# Patient Record
Sex: Female | Born: 1980 | Race: White | Hispanic: No | Marital: Married | Smoking: Current some day smoker
Health system: Southern US, Community
[De-identification: ages and names within clinical notes are randomized; demographics above are authoritative.]

## PROBLEM LIST (undated history)

## (undated) DIAGNOSIS — F4310 Post-traumatic stress disorder, unspecified: Secondary | ICD-10-CM

## (undated) DIAGNOSIS — R569 Unspecified convulsions: Secondary | ICD-10-CM

## (undated) DIAGNOSIS — J45909 Unspecified asthma, uncomplicated: Secondary | ICD-10-CM

## (undated) DIAGNOSIS — D6859 Other primary thrombophilia: Secondary | ICD-10-CM

## (undated) DIAGNOSIS — O0990 Supervision of high risk pregnancy, unspecified, unspecified trimester: Secondary | ICD-10-CM

## (undated) DIAGNOSIS — D509 Iron deficiency anemia, unspecified: Secondary | ICD-10-CM

## (undated) HISTORY — DX: Iron deficiency anemia, unspecified: D50.9

## (undated) HISTORY — DX: Supervision of high risk pregnancy, unspecified, unspecified trimester: O09.90

## (undated) HISTORY — DX: Post-traumatic stress disorder, unspecified: F43.10

## (undated) HISTORY — PX: MOUTH SURGERY: SHX715

## (undated) HISTORY — PX: DILATION AND CURETTAGE OF UTERUS: SHX78

---
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
## 2002-01-14 ENCOUNTER — Inpatient Hospital Stay (HOSPITAL_COMMUNITY): Admission: AD | Admit: 2002-01-14 | Discharge: 2002-01-14 | Payer: Self-pay | Admitting: *Deleted

## 2002-01-15 ENCOUNTER — Encounter: Payer: Self-pay | Admitting: *Deleted

## 2002-03-21 ENCOUNTER — Inpatient Hospital Stay (HOSPITAL_COMMUNITY): Admission: AD | Admit: 2002-03-21 | Discharge: 2002-03-21 | Payer: Self-pay | Admitting: Family Medicine

## 2002-03-24 ENCOUNTER — Emergency Department (HOSPITAL_COMMUNITY): Admission: EM | Admit: 2002-03-24 | Discharge: 2002-03-24 | Payer: Self-pay | Admitting: Emergency Medicine

## 2002-03-24 ENCOUNTER — Encounter: Payer: Self-pay | Admitting: Emergency Medicine

## 2002-03-31 ENCOUNTER — Ambulatory Visit (HOSPITAL_COMMUNITY): Admission: RE | Admit: 2002-03-31 | Discharge: 2002-03-31 | Payer: Self-pay | Admitting: *Deleted

## 2002-06-01 ENCOUNTER — Encounter: Payer: Self-pay | Admitting: Obstetrics and Gynecology

## 2002-06-01 ENCOUNTER — Inpatient Hospital Stay (HOSPITAL_COMMUNITY): Admission: AD | Admit: 2002-06-01 | Discharge: 2002-06-03 | Payer: Self-pay | Admitting: Obstetrics and Gynecology

## 2002-06-02 ENCOUNTER — Encounter (INDEPENDENT_AMBULATORY_CARE_PROVIDER_SITE_OTHER): Payer: Self-pay | Admitting: Specialist

## 2002-11-25 ENCOUNTER — Emergency Department (HOSPITAL_COMMUNITY): Admission: EM | Admit: 2002-11-25 | Discharge: 2002-11-25 | Payer: Self-pay | Admitting: *Deleted

## 2004-07-18 ENCOUNTER — Emergency Department (HOSPITAL_COMMUNITY): Admission: EM | Admit: 2004-07-18 | Discharge: 2004-07-18 | Payer: Self-pay | Admitting: Emergency Medicine

## 2005-02-23 ENCOUNTER — Emergency Department (HOSPITAL_COMMUNITY): Admission: EM | Admit: 2005-02-23 | Discharge: 2005-02-24 | Payer: Self-pay | Admitting: Emergency Medicine

## 2005-04-09 ENCOUNTER — Emergency Department (HOSPITAL_COMMUNITY): Admission: EM | Admit: 2005-04-09 | Discharge: 2005-04-09 | Payer: Self-pay | Admitting: Emergency Medicine

## 2005-05-02 ENCOUNTER — Emergency Department (HOSPITAL_COMMUNITY): Admission: EM | Admit: 2005-05-02 | Discharge: 2005-05-02 | Payer: Self-pay | Admitting: Emergency Medicine

## 2005-12-13 ENCOUNTER — Emergency Department (HOSPITAL_COMMUNITY): Admission: EM | Admit: 2005-12-13 | Discharge: 2005-12-13 | Payer: Self-pay | Admitting: Emergency Medicine

## 2006-02-22 ENCOUNTER — Ambulatory Visit: Payer: Self-pay | Admitting: Oncology

## 2006-02-27 ENCOUNTER — Ambulatory Visit (HOSPITAL_COMMUNITY): Admission: RE | Admit: 2006-02-27 | Discharge: 2006-02-27 | Payer: Self-pay | Admitting: *Deleted

## 2006-03-13 ENCOUNTER — Ambulatory Visit (HOSPITAL_COMMUNITY): Admission: RE | Admit: 2006-03-13 | Discharge: 2006-03-13 | Payer: Self-pay | Admitting: *Deleted

## 2006-03-23 ENCOUNTER — Inpatient Hospital Stay (HOSPITAL_COMMUNITY): Admission: EM | Admit: 2006-03-23 | Discharge: 2006-03-25 | Payer: Self-pay | Admitting: Emergency Medicine

## 2006-04-22 IMAGING — CR DG CHEST 2V
2 series · 2 of 2 positions shown · non-contrast
Comparison: none

CLINICAL DATA: Fever and chest pain.  History of asthma.
 CHEST - 2 VIEW: 
 PA and lateral chest.  No comparison studies are available.

[w chest pa]
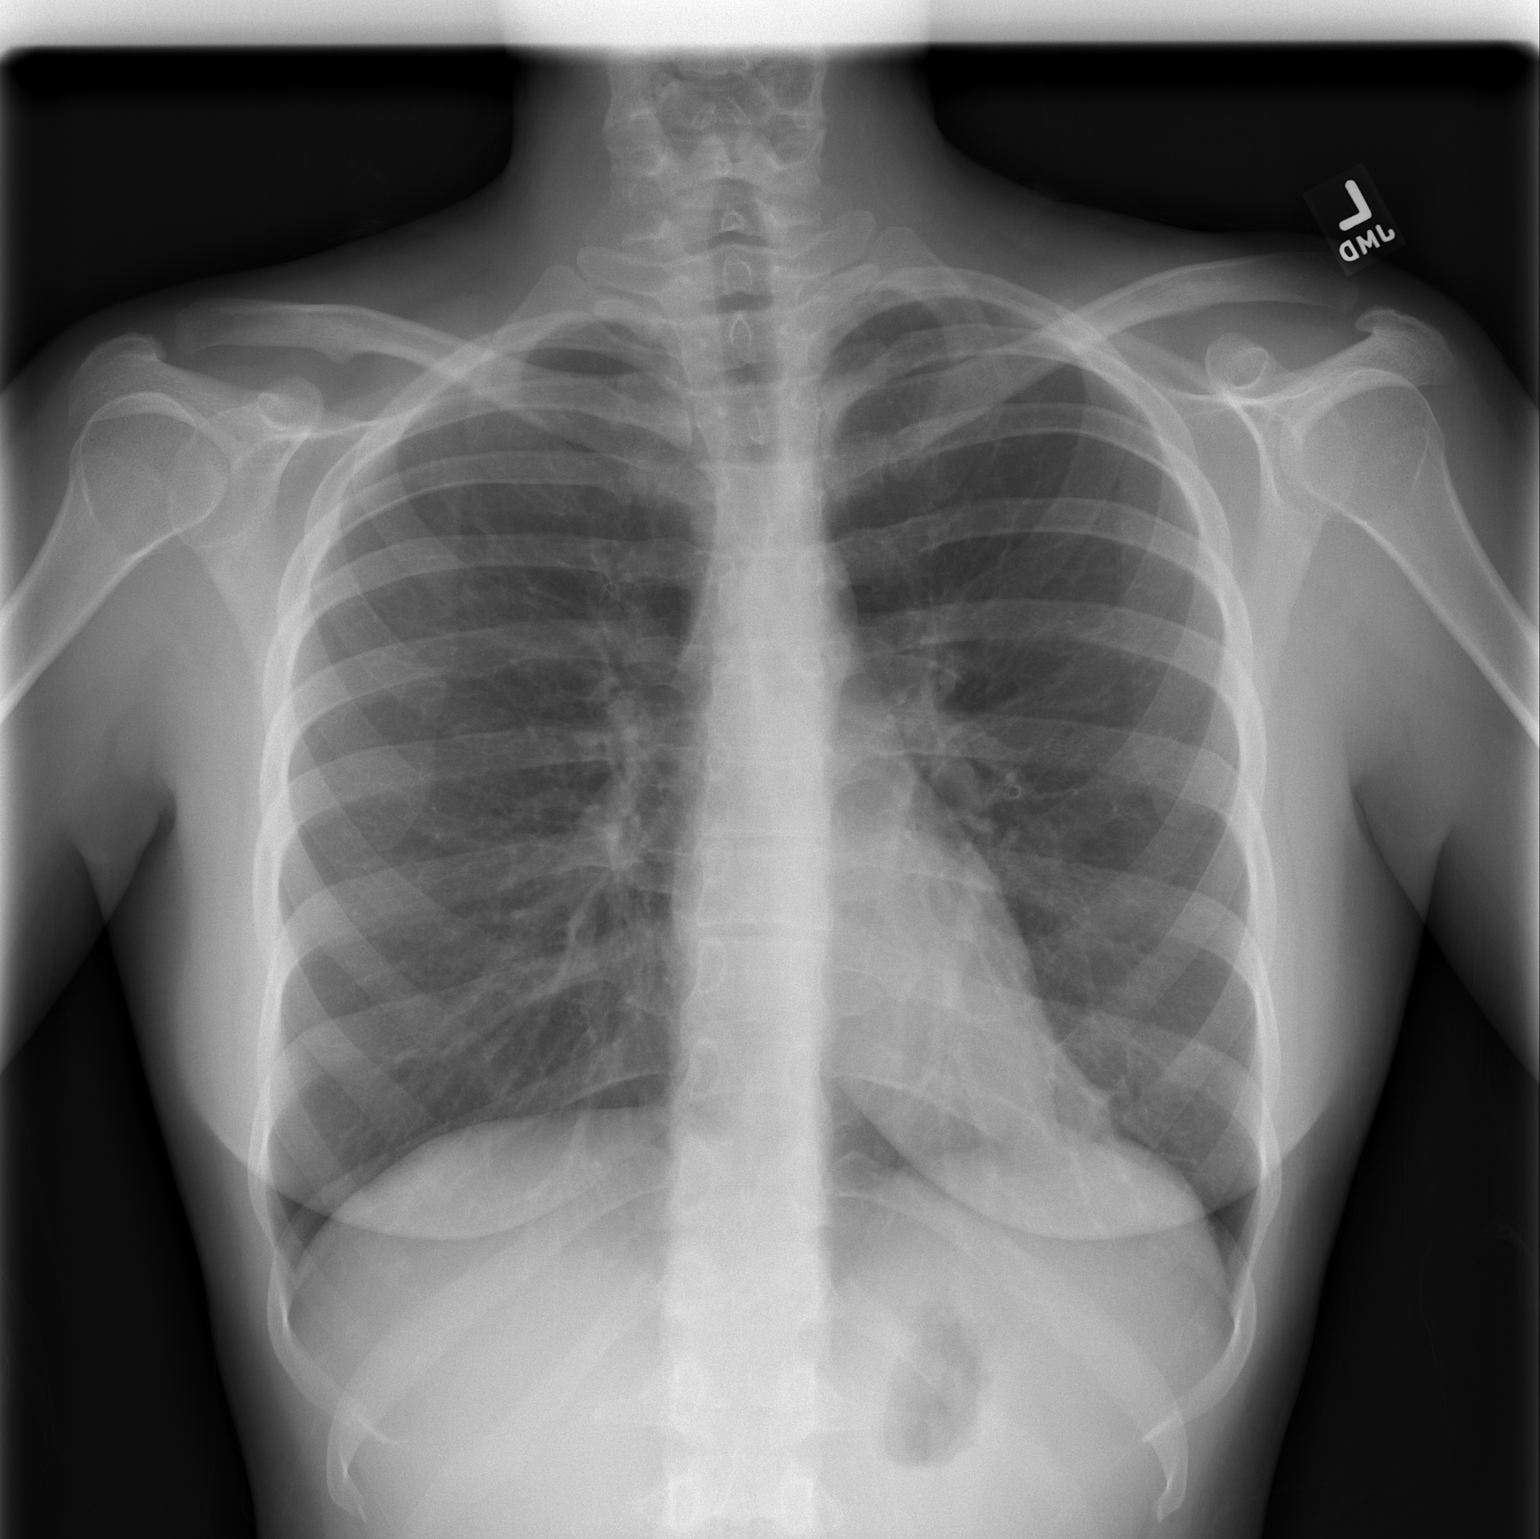

[w chest lat]
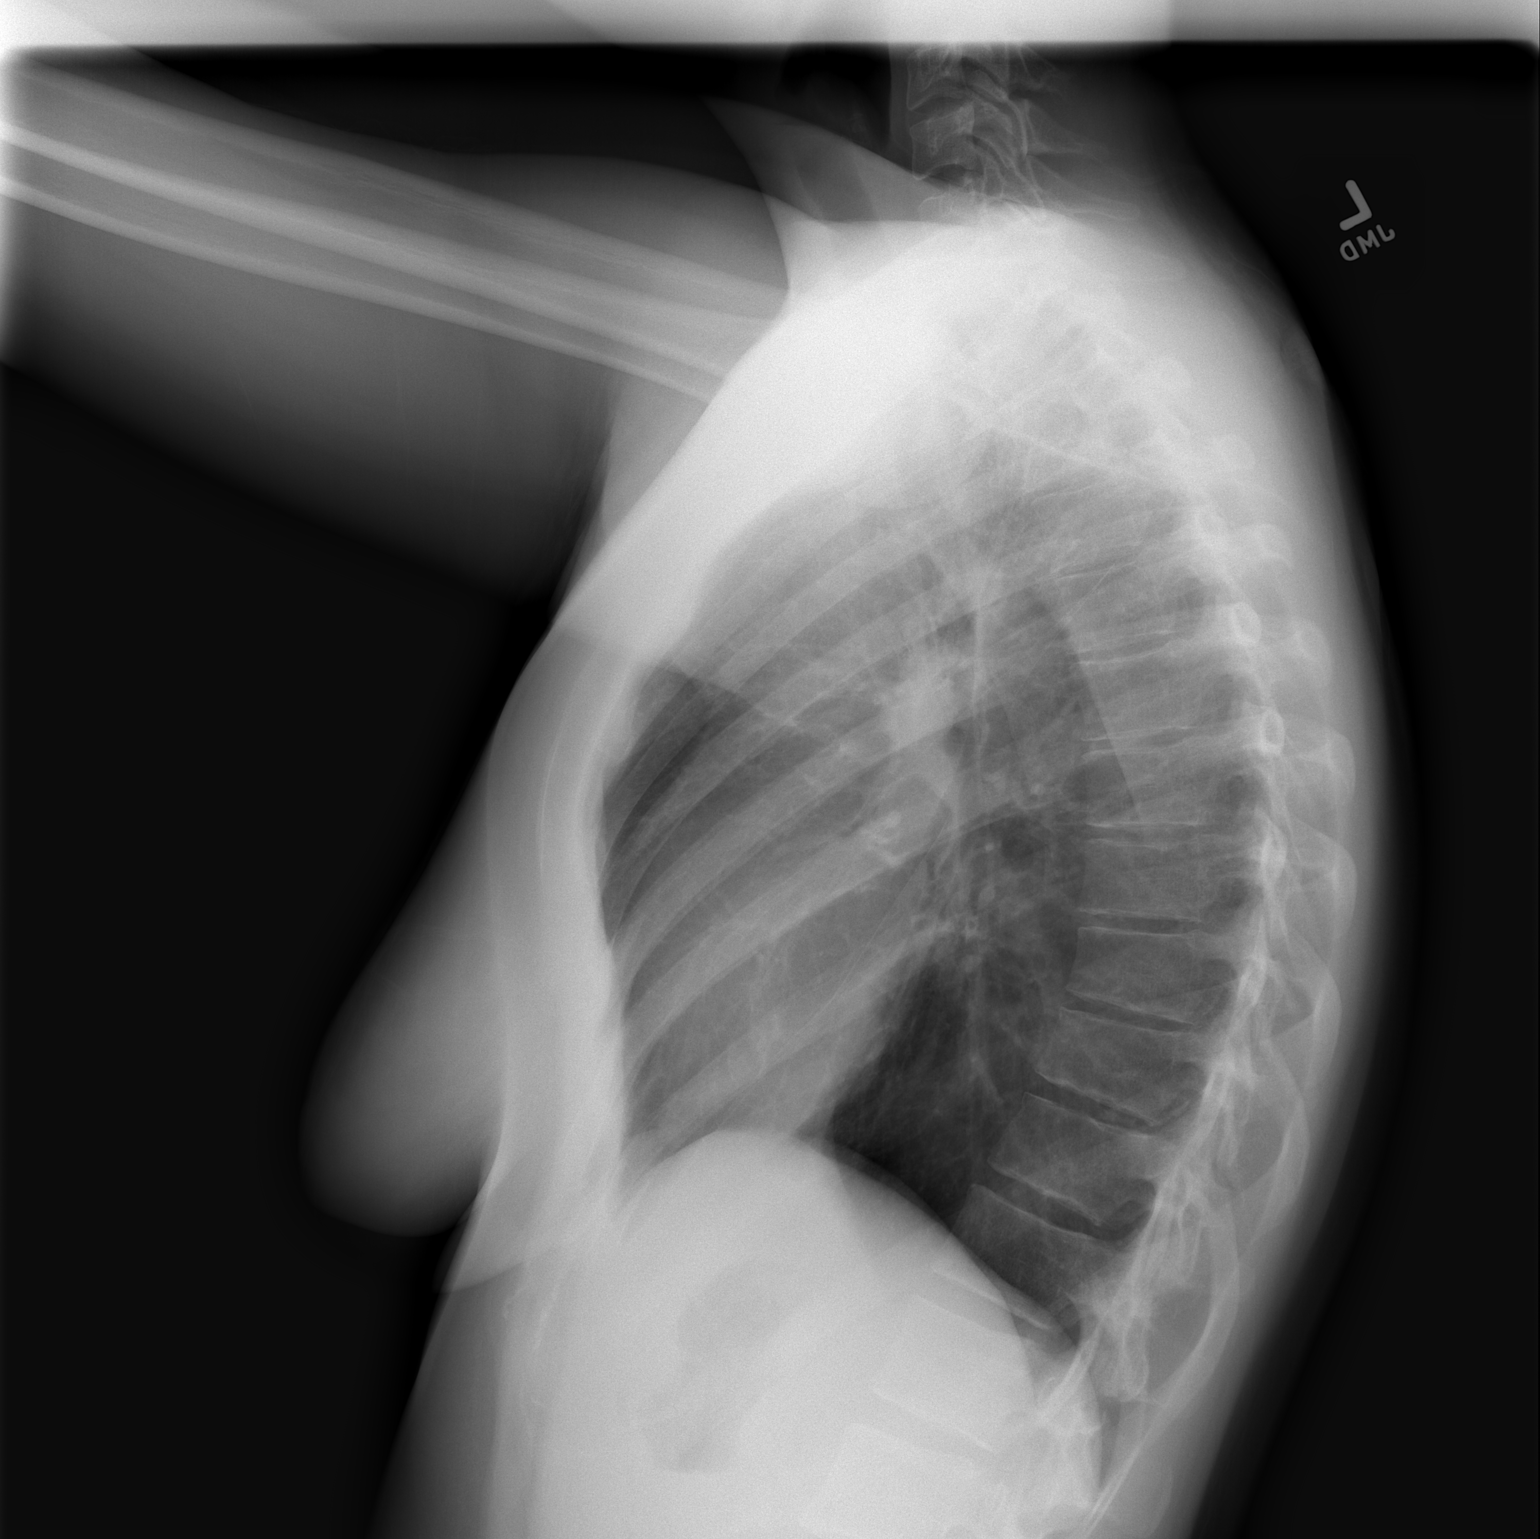

[2 of 2 positions shown; findings below may reference images not displayed]

FINDINGS: The cardiomediastinal contours are normal.  There is probably mild atelectasis in the lingula.  The lungs are otherwise clear.  There is no pleural effusion or pneumothorax.
IMPRESSION: Probable mild lingular atelectasis.  Otherwise unremarkable exam.

## 2006-04-23 ENCOUNTER — Ambulatory Visit: Payer: Self-pay | Admitting: Oncology

## 2006-04-25 LAB — CBC WITH DIFFERENTIAL/PLATELET
Eosinophils Absolute: 0.3 10*3/uL (ref 0.0–0.5)
LYMPH%: 17.7 % (ref 14.0–48.0)
MCHC: 35.6 g/dL (ref 32.0–36.0)
MCV: 85.3 fL (ref 81.0–101.0)
MONO%: 6.4 % (ref 0.0–13.0)
NEUT%: 69.8 % (ref 39.6–76.8)
Platelets: 215 10*3/uL (ref 145–400)
RBC: 4.01 10*6/uL (ref 3.70–5.32)

## 2006-04-25 LAB — MORPHOLOGY: RBC Comments: NORMAL

## 2006-05-01 LAB — PROTHROMBIN GENE MUTATION

## 2006-05-01 LAB — BETA-2 GLYCOPROTEIN ANTIBODIES: Beta-2-Glycoprotein I IgA: 7 U/mL (ref <10)

## 2006-05-01 LAB — PROTEIN S ACTIVITY: Protein S Activity: 68 % — ABNORMAL LOW (ref 81–180)

## 2006-05-01 LAB — PROTEIN C ACTIVITY: Protein C Activity: 89 % — ABNORMAL LOW (ref 91–147)

## 2006-05-01 LAB — PROTEIN S, ANTIGEN, FREE: Protein S Ag, Free: 18 % normal — ABNORMAL LOW (ref 50–147)

## 2006-05-07 ENCOUNTER — Encounter (INDEPENDENT_AMBULATORY_CARE_PROVIDER_SITE_OTHER): Payer: Self-pay | Admitting: *Deleted

## 2006-05-07 ENCOUNTER — Ambulatory Visit (HOSPITAL_COMMUNITY): Admission: RE | Admit: 2006-05-07 | Discharge: 2006-05-07 | Payer: Self-pay | Admitting: *Deleted

## 2006-06-04 LAB — PROTEIN S, TOTAL: Protein S Ag, Total: 88 % (ref 70–140)

## 2006-10-03 ENCOUNTER — Ambulatory Visit: Payer: Self-pay | Admitting: Oncology

## 2007-04-03 ENCOUNTER — Ambulatory Visit: Payer: Self-pay | Admitting: Oncology

## 2007-06-21 ENCOUNTER — Emergency Department (HOSPITAL_COMMUNITY): Admission: EM | Admit: 2007-06-21 | Discharge: 2007-06-21 | Payer: Self-pay | Admitting: Emergency Medicine

## 2008-01-23 ENCOUNTER — Ambulatory Visit (HOSPITAL_COMMUNITY): Admission: RE | Admit: 2008-01-23 | Discharge: 2008-01-23 | Payer: Self-pay | Admitting: Obstetrics and Gynecology

## 2008-03-16 ENCOUNTER — Ambulatory Visit (HOSPITAL_COMMUNITY): Admission: RE | Admit: 2008-03-16 | Discharge: 2008-03-16 | Payer: Self-pay | Admitting: Obstetrics and Gynecology

## 2009-08-14 ENCOUNTER — Inpatient Hospital Stay (HOSPITAL_COMMUNITY): Admission: EM | Admit: 2009-08-14 | Discharge: 2009-08-16 | Payer: Self-pay | Admitting: Emergency Medicine

## 2009-10-22 ENCOUNTER — Ambulatory Visit: Payer: Self-pay | Admitting: Oncology

## 2010-02-20 ENCOUNTER — Encounter: Payer: Self-pay | Admitting: *Deleted

## 2010-04-16 LAB — CBC
HCT: 26.2 % — ABNORMAL LOW (ref 36.0–46.0)
Hemoglobin: 8.5 g/dL — ABNORMAL LOW (ref 12.0–15.0)
MCH: 23.6 pg — ABNORMAL LOW (ref 26.0–34.0)
MCHC: 32.4 g/dL (ref 30.0–36.0)
MCV: 72.8 fL — ABNORMAL LOW (ref 78.0–100.0)
Platelets: 193 10*3/uL (ref 150–400)
RBC: 3.32 MIL/uL — ABNORMAL LOW (ref 3.87–5.11)
RBC: 3.6 MIL/uL — ABNORMAL LOW (ref 3.87–5.11)
WBC: 4.6 10*3/uL (ref 4.0–10.5)
WBC: 5.6 10*3/uL (ref 4.0–10.5)

## 2010-04-16 LAB — COMPREHENSIVE METABOLIC PANEL
AST: 17 U/L (ref 0–37)
Albumin: 3.4 g/dL — ABNORMAL LOW (ref 3.5–5.2)
Alkaline Phosphatase: 42 U/L (ref 39–117)
Chloride: 107 mEq/L (ref 96–112)
GFR calc Af Amer: >60 mL/min (ref >60)
Potassium: 4.1 mEq/L (ref 3.5–5.1)
Sodium: 141 mEq/L (ref 135–145)
Total Bilirubin: 0.5 mg/dL (ref 0.3–1.2)

## 2010-04-16 LAB — BASIC METABOLIC PANEL
Calcium: 8.7 mg/dL (ref 8.4–10.5)
GFR calc Af Amer: >60 mL/min (ref >60)
Potassium: 3.8 mEq/L (ref 3.5–5.1)
Sodium: 139 mEq/L (ref 135–145)

## 2010-04-16 LAB — HEPATIC FUNCTION PANEL
ALT: 13 U/L (ref 0–35)
Bilirubin, Direct: 0.2 mg/dL (ref 0.0–0.3)
Indirect Bilirubin: 0.2 mg/dL — ABNORMAL LOW (ref 0.3–0.9)

## 2010-04-16 LAB — RETICULOCYTES
RBC.: 3.9 MIL/uL (ref 3.87–5.11)
Retic Count, Absolute: 54.6 10*3/uL (ref 19.0–186.0)
Retic Ct Pct: 1.4 % (ref 0.4–3.1)

## 2010-04-16 LAB — DIFFERENTIAL
Basophils Relative: 0 % (ref 0–1)
Eosinophils Absolute: 0.1 10*3/uL (ref 0.0–0.7)
Eosinophils Relative: 1 % (ref 0–5)
Monocytes Absolute: 0.4 10*3/uL (ref 0.1–1.0)
Monocytes Relative: 8 % (ref 3–12)

## 2010-04-16 LAB — FOLATE: Folate: 11.6 ng/mL

## 2010-04-16 LAB — FERRITIN: Ferritin: 2 ng/mL — ABNORMAL LOW (ref 10–291)

## 2010-04-16 LAB — RAPID URINE DRUG SCREEN, HOSP PERFORMED
Amphetamines: NOT DETECTED
Opiates: NOT DETECTED
Tetrahydrocannabinol: NOT DETECTED

## 2010-04-16 LAB — IRON AND TIBC: Iron: 15 ug/dL — ABNORMAL LOW (ref 42–135)

## 2010-06-17 NOTE — Discharge Summary (Signed)
NAME:  Kerry Duncan, Kerry Duncan                       ACCOUNT NO.:  [id number]   MEDICAL RECORD NO.:  [id number]                   PATIENT TYPE:  INP   LOCATION:  9307                                 FACILITY:  [facility]   PHYSICIAN:  Rudy Jew. Ashley Royalty, M.D.             DATE OF BIRTH:  1980/10/29   DATE OF ADMISSION:  06/01/2002  DATE OF DISCHARGE:  06/03/2002                                 DISCHARGE SUMMARY   DISCHARGE DIAGNOSES:  1. Intrauterine pregnancy at 26 weeks 6 days gestation.  2. Intrauterine fetal demise.  3. Asthma.  4. Rh negative blood type.   OPERATIONS/PROCEDURES/TREATMENT:  OB delivery.   CONSULTATIONS:  None.   DISCHARGE MEDICATIONS:  Advil or facsimile.   HISTORY OF PRESENT ILLNESS:  This is a 30 year old prima gravida 26 weeks 6  days gestation. Prenatal care was complicated by the aforementioned risk  factors. The patient also had an atypical Pap. She had a transient marginal  previa which resolved. She presented unannounced to triage complaining of no  fetal movement at 6:06 p.m. on May 31, 2002. In triage, she was noted to have  no fetal heart auscultated. Subsequent ultrasound revealed no fetal heart  motion and thus, intrauterine fetal demise.   HOSPITAL COURSE:  The patient was admitted to [facility] of  [place]. Initial laboratory studies were drawn. She was given Cytotec in  order to induce labor. By 3:04, she delivered a female fetus. Apgar's 0 at  one minute and 0 at five minutes. The infant delivered precipitously in bed.  After delivery, I inspected the fetus and noted the umbilical cord to be  markedly constricted at the junction with the infant's body. Placenta and  membranes were removed and intact. There was no episiotomy or lacerations.  The patient did have an epidural for labor. The patient and husband were  asked regarding their desires with respect to autopsy and declined the same.   DISPOSITION:  The patient was discharged Jun 03, 2002,  afebrile and in  satisfactory condition.   OBSTETRIC LABORATORY FINDINGS:  Hemoglobin and hematocrit on admission were  11.3 and 34.4 respectively. White blood count was 11,200. Repeat studies  obtained Jun 03, 2002 revealed hemoglobin and hematocrit of 11.1 and 32.0.  White blood count was 10,000. Lupus anticoagulant was obtained which was  negative. ANA and anticardiolipin assay's were obtained as well. The ANA was  negative and anticardiolipin results are pending at the time of this  dictation.   FOLLOW UP:  The patient is to return to [facility] in several weeks  for followup.                                               James A. Ashley Royalty, M.D.   JAM/MEDQ  D:  07/17/2002  T:  07/17/2002  Job:  [id number]

## 2010-06-17 NOTE — Op Note (Signed)
NAME:  Kerry Duncan, Kerry Duncan             ACCOUNT NO.:  [id number]   MEDICAL RECORD NO.:  [id number]          PATIENT TYPE:  AMB   LOCATION:  SDC                           FACILITY:  [facility]   PHYSICIAN:  Battlement Mesa B. Earlene Plater, M.D.  DATE OF BIRTH:  December 08, 1980   DATE OF PROCEDURE:  05/07/2006  DATE OF DISCHARGE:                               OPERATIVE REPORT   PREOPERATIVE DIAGNOSIS:  Missed abortion versus ectopic pregnancy.   POSTOPERATIVE DIAGNOSIS:  Missed abortion versus ectopic pregnancy.   PROCEDURE:  Suction curettage, ultrasound guided.   SURGEON:  Chester Holstein. Earlene Plater, M.D.   ASSISTANT:  None.   ANESTHESIA:  MAC, 20 cc, 1% Nesacaine paracervical block.   SPECIMENS:  Endometrial curettings to pathology.   BLOOD LOSS:  Minimal.   COMPLICATIONS:  None.   INDICATIONS:  Patient with a history of a 27 week IUFD, protein C and  protein S deficient, who was followed with early quantitative HCG levels  due to previous poor obstetric history.  She has had inappropriate quant  trend initially; however, in the low teens, subsequently doubling up  into the 50s and continuing now to rise at less than 50% every 48 hours.  Patient advised this certainly is an abnormal gestation, whether  intrauterine or ectopic is yet to be determined.  Patient presents for  suction curettage to see if pregnancy tissue is present in the uterus or  not.  Patient is advised to the risks of surgery as to infection,  bleeding, perforation, damage to tissue or surrounding organs.   PROCEDURE:  Patient taken to the operating room.  MAC anesthesia  obtained.  Placed in Allen's stirrups.  Prepped and draped in a standard  fashion.  Anesthesia showed a retroverted uterus.  No adnexal masses  palpable.   The speculum inserted.  Paracervical block placed.  Ultrasound guidance  used but limited utility due to retroverted uterus and limited by bowel  gas as well.  The cervix was dilated to #21.  A #7 suction cannula  inserted.   A small amount of tissue returned.  Endometrium gently  curetted.  A gritty texture noted; therefore, the procedure terminated.   Instruments were removed.  The cervix was hemostatic.   The patient tolerated the procedure well with no complications.  The  patient was taken to the recovery room in an awake and alert, stable  condition.      [name] B. Earlene Plater, M.D.  Electronically Signed     WBD/MEDQ  D:  05/07/2006  T:  05/07/2006  Job:  [id number]

## 2010-06-17 NOTE — H&P (Signed)
Kerry Duncan, Kerry Duncan             ACCOUNT NO.:  [id number]   MEDICAL RECORD NO.:  [id number]          Duncan TYPE:  INP   LOCATION:  0103                         FACILITY:  [facility]   PHYSICIAN:  Elliot Cousin, M.D.    DATE OF BIRTH:  Dec 30, 1980   DATE OF ADMISSION:  03/22/2006  DATE OF DISCHARGE:                              HISTORY & PHYSICAL   PRIORITY HISTORY AND PHYSICAL   PRIMARY CARE PHYSICIAN:  Kerry Duncan is unassigned.   PRIMARY GYNECOLOGIST:  [name] B. Earlene Plater, MD.   CHIEF COMPLAINT:  Shortness of breath, chest congestion, and cough.   HISTORY OF PRESENT ILLNESS:  Kerry Duncan is a 30 year old woman with a  past medical history significant for asthma, who presents to Kerry  emergency department with a two-day history of shortness of breath,  chest congestion, and a nonproductive cough.  Kerry Duncan shortness of breath  worsens with activity.  Kerry Duncan has associated pleurisy located centrally.  Kerry Duncan has had a dry, nonproductive cough, although Kerry Duncan feels as if Kerry Duncan has  a lot of chest congestion and is unable to expectorate.  Kerry Duncan has had  some wheezing.  Kerry Duncan has had subjective and objective fever and chills at  home.  Kerry Duncan fever at home was 102.2 yesterday.  Kerry Duncan has had some  generalized achiness in Kerry Duncan legs.  Kerry Duncan has had a generalized headache  without any complaints of photophobia or a stiff neck.  Kerry Duncan has had a  runny nose.  Kerry Duncan denies earache and sore throat.  Kerry Duncan denies associated  nausea, vomiting, and diarrhea.  Kerry Duncan has not had any pain with  urination.  Kerry Duncan took over-Kerry-counter Tylenol, Advil,  and Tussin on  several occasions with no relief.  Kerry Duncan was exposed to Kerry Duncan mother, who  apparently had some of Kerry same symptoms.  Up until two days ago, Kerry  Duncan smoked one pack of cigarettes per day.   During Kerry evaluation in Kerry emergency department, Kerry Duncan is noted  to be febrile with a temperature of 101.0.  A CT scan of Kerry chest was  obtained following Kerry revelation of an  elevated D-dimer of 0.57.  Kerry  CT scan results reveal negative for PE, subtle, patchy, ground glass  opacification in Kerry right middle lobe with areas of bronchiectasis in  Kerry right upper lobe.  Kerry Duncan will therefore be admitted for  further evaluation and management.   PAST MEDICAL HISTORY:  1. Asthma.  No history of intubation.  Kerry Duncan has asthma exacerbations      requiring emergency room visits approximately twice yearly.  2. Stillbirth in June of 2004.  Kerry Duncan states that Kerry Duncan      gynecologist suspected a blood clot in Kerry umbilical cord.  3. 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 Earlene Plater.  4. Tobacco abuse, stopped two days ago.   MEDICATIONS:  1. Over-Kerry-counter Advil as needed.  2. Over-Kerry-counter Tussin as needed.  3. Multivitamin once daily.  4. Albuterol 2 puffs three to four times daily as needed.   ALLERGIES:  No known drug allergies.   SOCIAL HISTORY:  Kerry Duncan is married.  Kerry Duncan lives in [place].  Kerry Duncan  smoked one pack of cigarettes per day for seven years up until two days  ago.  Kerry Duncan denies alcohol and illicit drug use.  Kerry Duncan is currently  unemployed.   FAMILY HISTORY:  Kerry Duncan mother is 43 years of age and has a history of  hypertension and diabetes mellitus.  Kerry Duncan father is 41 years of age and  has a history of myocardial infarction and COPD (no apparent PE or DVT  in either parent).   REVIEW OF SYSTEMS:  As above in Kerry History of Present Illness.  Otherwise, review of systems is negative.   PHYSICAL EXAMINATION:  VITAL SIGNS:  Temperature 101.0, blood pressure  115/69, pulse 107, respiratory rate 24, oxygen saturation 100% on 3  liters of nasal cannula oxygen.  GENERAL:  Kerry Duncan is a pleasant, 30 year old, Caucasian woman, who  is currently sitting up in bed in no acute distress.  HEENT:  Head is normocephalic and nontraumatic, pupils are equal, round,  and reactive to light, extraocular  movements are intact, conjunctivae  are clear, sclerae are white, tympanic membranes are clear bilaterally.  Nasal mucosa is moist, with no active drainage.  No sinus tenderness.  Oropharynx reveals good dentition.  Mucous membranes are moist.  No  posterior exudates or erythema.  NECK:  Supple, no adenopathy, no thyromegaly, no bruit, no JVD.  LUNGS:  Breathing is mildly labored.  No accessory muscles used.  Mild,  occasional crackles and wheezes auscultated bilaterally.  Breath sounds  audible down to Kerry bases.  HEART:  S1 S2 with mild tachycardia.  ABDOMEN:  Positive bowel sounds, soft, nontender, and nondistended, no  hepatosplenomegaly, no masses palpated.  EXTREMITIES:  Pedal pulses are 2+ bilaterally.  No pretibial edema and  no pedal edema.  Kerry Duncan has excellent range of motion of Kerry Duncan  extremities bilaterally, upper and lower.  No acute joint findings.  No  obvious edema, erythema, or warmth of Kerry Duncan joints.  SKIN:  Good skin turgor.  No apparent rash.   ADMISSION LABORATORY DATA:  CT scan of Kerry chest, results are above.  D-  dimer 0.57, sodium 133, potassium 3.4, chloride 104, CO2 20, glucose  115, BUN 5, creatinine 0.50, calcium 8.9, WBC 4.5, hemoglobin 13.3,  platelets 137.  Chest x-ray reveals borderline airway thickening  possibly reflecting bronchitis or reactive airway disease.  No air space  opacity is identified.   ASSESSMENT:  1. Right middle lobe pneumonia with areas of bronchiectasis of Kerry      right upper lobe.  Kerry Duncan also has superimposed asthma with      mild exacerbation.  2. Tobacco abuse, recently discontinued.  3. Fever secondary to number one.  4. Hypokalemia.  Kerry Duncan's serum potassium is 3.3.  Magnesium      deficiency will need to be ruled out.  5. Thrombocytopenia.  Kerry Duncan's platelet count is 137.  Kerry      etiology is unclear at this time. 6. Recent diagnosis of protein-C and protein-S deficiency.  Kerry      Duncan has an  upcoming appointment with hematologist, Dr.      Cyndie Chime, in March of 2008.   PLAN:  1. Kerry Duncan will be admitted for further evaluation and management.  2. Will check blood and sputum cultures.  3. Will start Rocephin and Azithromycin intravenously.  4. Will start Albuterol and Atrovent nebulizers  q.4h and then q.2h      p.r.n.  5. Symptomatic treatment with Mucinex, Robitussin, and Tussionex      p.r.n.  6. Prophylactic Lovenox.  Will follow Kerry Duncan platelet count closely on      prophylactic Lovenox.  7. Prophylactic Protonix.      Elliot Cousin, M.D.  Electronically Signed     DF/MEDQ  D:  03/23/2006  T:  03/23/2006  Job:  [id number]   cc:   [name] B. Earlene Plater, M.D.  Fax: [phone]

## 2012-02-25 ENCOUNTER — Encounter (HOSPITAL_BASED_OUTPATIENT_CLINIC_OR_DEPARTMENT_OTHER): Payer: Self-pay

## 2012-02-25 ENCOUNTER — Observation Stay (HOSPITAL_BASED_OUTPATIENT_CLINIC_OR_DEPARTMENT_OTHER)
Admission: EM | Admit: 2012-02-25 | Discharge: 2012-02-26 | Disposition: A | Discharge status: Home / Self Care | Payer: 59 | Attending: Internal Medicine | Admitting: Internal Medicine

## 2012-02-25 ENCOUNTER — Inpatient Hospital Stay (HOSPITAL_COMMUNITY): Payer: 59

## 2012-02-25 ENCOUNTER — Emergency Department (HOSPITAL_BASED_OUTPATIENT_CLINIC_OR_DEPARTMENT_OTHER): Payer: 59

## 2012-02-25 DIAGNOSIS — D649 Anemia, unspecified: Secondary | ICD-10-CM

## 2012-02-25 DIAGNOSIS — J45901 Unspecified asthma with (acute) exacerbation: Secondary | ICD-10-CM | POA: Diagnosis present

## 2012-02-25 DIAGNOSIS — J9691 Respiratory failure, unspecified with hypoxia: Secondary | ICD-10-CM

## 2012-02-25 DIAGNOSIS — R0902 Hypoxemia: Secondary | ICD-10-CM | POA: Insufficient documentation

## 2012-02-25 DIAGNOSIS — J111 Influenza due to unidentified influenza virus with other respiratory manifestations: Secondary | ICD-10-CM

## 2012-02-25 DIAGNOSIS — Z23 Encounter for immunization: Secondary | ICD-10-CM | POA: Insufficient documentation

## 2012-02-25 DIAGNOSIS — J45909 Unspecified asthma, uncomplicated: Secondary | ICD-10-CM

## 2012-02-25 DIAGNOSIS — F17200 Nicotine dependence, unspecified, uncomplicated: Secondary | ICD-10-CM | POA: Insufficient documentation

## 2012-02-25 DIAGNOSIS — J9600 Acute respiratory failure, unspecified whether with hypoxia or hypercapnia: Principal | ICD-10-CM | POA: Insufficient documentation

## 2012-02-25 DIAGNOSIS — R7989 Other specified abnormal findings of blood chemistry: Secondary | ICD-10-CM

## 2012-02-25 DIAGNOSIS — Z79899 Other long term (current) drug therapy: Secondary | ICD-10-CM | POA: Insufficient documentation

## 2012-02-25 DIAGNOSIS — D6859 Other primary thrombophilia: Secondary | ICD-10-CM | POA: Diagnosis present

## 2012-02-25 HISTORY — DX: Unspecified asthma, uncomplicated: J45.909

## 2012-02-25 HISTORY — DX: Other primary thrombophilia: D68.59

## 2012-02-25 HISTORY — DX: Unspecified convulsions: R56.9

## 2012-02-25 LAB — CBC WITH DIFFERENTIAL/PLATELET
Basophils Absolute: 0 10*3/uL (ref 0.0–0.1)
Basophils Relative: 0 % (ref 0–1)
Eosinophils Relative: 1 % (ref 0–5)
HCT: 29.8 % — ABNORMAL LOW (ref 36.0–46.0)
Hemoglobin: 9.4 g/dL — ABNORMAL LOW (ref 12.0–15.0)
Lymphocytes Relative: 13 % (ref 12–46)
MCHC: 31.5 g/dL (ref 30.0–36.0)
MCV: 81.6 fL (ref 78.0–100.0)
Monocytes Absolute: 0.1 10*3/uL (ref 0.1–1.0)
Monocytes Relative: 5 % (ref 3–12)
Neutro Abs: 1.7 10*3/uL (ref 1.7–7.7)
RDW: 19 % — ABNORMAL HIGH (ref 11.5–15.5)

## 2012-02-25 LAB — BASIC METABOLIC PANEL
BUN: 6 mg/dL (ref 6–23)
CO2: 19 mEq/L (ref 19–32)
Calcium: 8.5 mg/dL (ref 8.4–10.5)
Chloride: 107 mEq/L (ref 96–112)
Creatinine, Ser: 0.5 mg/dL (ref 0.50–1.10)

## 2012-02-25 LAB — INFLUENZA PANEL BY PCR (TYPE A & B): H1N1 flu by pcr: NOT DETECTED

## 2012-02-25 LAB — CBC
MCH: 25.8 pg — ABNORMAL LOW (ref 26.0–34.0)
MCHC: 32.2 g/dL (ref 30.0–36.0)
MCV: 80 fL (ref 78.0–100.0)
Platelets: 172 10*3/uL (ref 150–400)
RDW: 19 % — ABNORMAL HIGH (ref 11.5–15.5)
WBC: 2 10*3/uL — ABNORMAL LOW (ref 4.0–10.5)

## 2012-02-25 MED ORDER — ONDANSETRON 8 MG PO TBDP: 8.0000 mg | ORAL_TABLET | Freq: Three times a day (TID) | ORAL | Status: DC | PRN

## 2012-02-25 MED ORDER — GUAIFENESIN-DM 100-10 MG/5ML PO SYRP
5.0000 mL | ORAL_SOLUTION | ORAL | Status: DC | PRN
  Administered 2012-02-25 – 2012-02-26 (×2): 5 mL via ORAL
  Filled 2012-02-25 (×2): qty 5

## 2012-02-25 MED ORDER — PREDNISONE 50 MG PO TABS
60.0000 mg | ORAL_TABLET | Freq: Once | ORAL | Status: AC
  Administered 2012-02-25: 60 mg via ORAL
  Filled 2012-02-25: qty 1

## 2012-02-25 MED ORDER — AZITHROMYCIN 500 MG PO TABS
500.0000 mg | ORAL_TABLET | Freq: Every day | ORAL | Status: AC
  Administered 2012-02-25: 500 mg via ORAL
  Filled 2012-02-25: qty 1

## 2012-02-25 MED ORDER — ENOXAPARIN SODIUM 40 MG/0.4ML ~~LOC~~ SOLN
40.0000 mg | SUBCUTANEOUS | Status: DC
  Administered 2012-02-25: 40 mg via SUBCUTANEOUS
  Filled 2012-02-25 (×2): qty 0.4

## 2012-02-25 MED ORDER — SODIUM CHLORIDE 0.9 % IV SOLN
INTRAVENOUS | Status: AC
  Administered 2012-02-25: 18:00:00 via INTRAVENOUS

## 2012-02-25 MED ORDER — IOHEXOL 350 MG/ML SOLN: 100.0000 mL | Freq: Once | INTRAVENOUS | Status: AC | PRN

## 2012-02-25 MED ORDER — CARISOPRODOL 350 MG PO TABS
350.0000 mg | ORAL_TABLET | Freq: Four times a day (QID) | ORAL | Status: DC | PRN
  Administered 2012-02-25 – 2012-02-26 (×3): 350 mg via ORAL
  Filled 2012-02-25 (×4): qty 1

## 2012-02-25 MED ORDER — METHYLPREDNISOLONE SODIUM SUCC 125 MG IJ SOLR
80.0000 mg | Freq: Two times a day (BID) | INTRAMUSCULAR | Status: DC
  Administered 2012-02-25 – 2012-02-26 (×2): 80 mg via INTRAVENOUS
  Filled 2012-02-25 (×3): qty 1.28

## 2012-02-25 MED ORDER — ALBUTEROL SULFATE (5 MG/ML) 0.5% IN NEBU
5.0000 mg | INHALATION_SOLUTION | Freq: Once | RESPIRATORY_TRACT | Status: AC
  Administered 2012-02-25: 5 mg via RESPIRATORY_TRACT
  Filled 2012-02-25: qty 1

## 2012-02-25 MED ORDER — SODIUM CHLORIDE 0.9 % IJ SOLN
3.0000 mL | Freq: Two times a day (BID) | INTRAMUSCULAR | Status: DC
  Administered 2012-02-25: 3 mL via INTRAVENOUS

## 2012-02-25 MED ORDER — IOHEXOL 350 MG/ML SOLN
100.0000 mL | Freq: Once | INTRAVENOUS | Status: AC | PRN
  Administered 2012-02-25: 100 mL via INTRAVENOUS

## 2012-02-25 MED ORDER — IOHEXOL 350 MG/ML SOLN: 100.0000 mL | Freq: Once | INTRAVENOUS | Status: DC | PRN

## 2012-02-25 MED ORDER — ALBUTEROL SULFATE (5 MG/ML) 0.5% IN NEBU
5.0000 mg | INHALATION_SOLUTION | Freq: Once | RESPIRATORY_TRACT | Status: AC
  Administered 2012-02-25: 5 mg via RESPIRATORY_TRACT

## 2012-02-25 MED ORDER — SODIUM CHLORIDE 0.9 % IJ SOLN: 3.0000 mL | INTRAMUSCULAR | Status: DC | PRN

## 2012-02-25 MED ORDER — POTASSIUM CHLORIDE CRYS ER 20 MEQ PO TBCR
40.0000 meq | EXTENDED_RELEASE_TABLET | Freq: Once | ORAL | Status: AC
  Administered 2012-02-25: 40 meq via ORAL
  Filled 2012-02-25: qty 2

## 2012-02-25 MED ORDER — IPRATROPIUM BROMIDE 0.02 % IN SOLN
0.5000 mg | Freq: Four times a day (QID) | RESPIRATORY_TRACT | Status: DC
  Administered 2012-02-25 – 2012-02-26 (×4): 0.5 mg via RESPIRATORY_TRACT
  Filled 2012-02-25 (×5): qty 2.5

## 2012-02-25 MED ORDER — SODIUM CHLORIDE 0.9 % IJ SOLN
3.0000 mL | Freq: Two times a day (BID) | INTRAMUSCULAR | Status: DC
  Administered 2012-02-25 – 2012-02-26 (×3): 3 mL via INTRAVENOUS

## 2012-02-25 MED ORDER — SODIUM CHLORIDE 0.9 % IV SOLN: 250.0000 mL | INTRAVENOUS | Status: DC | PRN

## 2012-02-25 MED ORDER — SODIUM CHLORIDE 0.9 % IV BOLUS (SEPSIS)
1000.0000 mL | Freq: Once | INTRAVENOUS | Status: AC
  Administered 2012-02-25: 1000 mL via INTRAVENOUS

## 2012-02-25 MED ORDER — ENOXAPARIN SODIUM 80 MG/0.8ML ~~LOC~~ SOLN
SUBCUTANEOUS | Status: AC
  Administered 2012-02-25: 61 mg
  Filled 2012-02-25: qty 0.8

## 2012-02-25 MED ORDER — ALBUTEROL SULFATE (5 MG/ML) 0.5% IN NEBU
INHALATION_SOLUTION | RESPIRATORY_TRACT | Status: AC
  Administered 2012-02-25: 5 mg via RESPIRATORY_TRACT
  Filled 2012-02-25: qty 1

## 2012-02-25 MED ORDER — PREDNISONE 10 MG PO TABS: 20.0000 mg | ORAL_TABLET | Freq: Every day | ORAL | Status: DC

## 2012-02-25 MED ORDER — AZITHROMYCIN 250 MG PO TABS
250.0000 mg | ORAL_TABLET | Freq: Every day | ORAL | Status: DC
  Administered 2012-02-26: 250 mg via ORAL
  Filled 2012-02-25: qty 1

## 2012-02-25 MED ORDER — IPRATROPIUM BROMIDE 0.02 % IN SOLN
RESPIRATORY_TRACT | Status: AC
  Administered 2012-02-25: 0.5 mg via RESPIRATORY_TRACT
  Filled 2012-02-25: qty 2.5

## 2012-02-25 MED ORDER — IPRATROPIUM BROMIDE 0.02 % IN SOLN
0.5000 mg | Freq: Once | RESPIRATORY_TRACT | Status: AC
  Administered 2012-02-25: 0.5 mg via RESPIRATORY_TRACT

## 2012-02-25 MED ORDER — ALBUTEROL SULFATE (5 MG/ML) 0.5% IN NEBU: 2.5000 mg | INHALATION_SOLUTION | RESPIRATORY_TRACT | Status: DC | PRN

## 2012-02-25 MED ORDER — OSELTAMIVIR PHOSPHATE 75 MG PO CAPS
75.0000 mg | ORAL_CAPSULE | Freq: Once | ORAL | Status: AC
  Administered 2012-02-25: 75 mg via ORAL
  Filled 2012-02-25: qty 1

## 2012-02-25 MED ORDER — CLONAZEPAM 1 MG PO TABS
2.0000 mg | ORAL_TABLET | Freq: Every day | ORAL | Status: DC
  Administered 2012-02-25: 2 mg via ORAL
  Filled 2012-02-25: qty 2

## 2012-02-25 MED ORDER — ALBUTEROL SULFATE (5 MG/ML) 0.5% IN NEBU
2.5000 mg | INHALATION_SOLUTION | Freq: Four times a day (QID) | RESPIRATORY_TRACT | Status: DC
  Administered 2012-02-25 – 2012-02-26 (×4): 2.5 mg via RESPIRATORY_TRACT
  Filled 2012-02-25 (×5): qty 0.5

## 2012-02-25 MED ORDER — IPRATROPIUM BROMIDE 0.02 % IN SOLN
0.5000 mg | Freq: Once | RESPIRATORY_TRACT | Status: AC
  Administered 2012-02-25: 0.5 mg via RESPIRATORY_TRACT
  Filled 2012-02-25: qty 2.5

## 2012-02-25 MED ORDER — ENOXAPARIN SODIUM 150 MG/ML ~~LOC~~ SOLN: 1.0000 mg/kg | Freq: Once | SUBCUTANEOUS | Status: DC

## 2012-02-25 MED ORDER — HYDROCODONE-ACETAMINOPHEN 5-325 MG PO TABS
2.0000 | ORAL_TABLET | ORAL | Status: DC | PRN
  Administered 2012-02-25 – 2012-02-26 (×4): 2 via ORAL
  Filled 2012-02-25 (×5): qty 2

## 2012-02-25 MED ORDER — POTASSIUM CHLORIDE 20 MEQ PO PACK: 40.0000 meq | PACK | Freq: Once | ORAL | Status: DC

## 2012-02-25 NOTE — ED Notes (Signed)
Pt requesting comfortable at this time. Pt still has 200 ml left of her fluid. Pt states "breathing is better" K. Dangerfield RN notified.

## 2012-02-25 NOTE — Progress Notes (Addendum)
PENDING ACCEPTANCE TRANFER NOTE:  Call received from:    D. Ray   REASON FOR REQUESTING TRANSFER:    Hypoxia  HPI:   33 y-o with history of protein-S deficiency and anemia came in to the [facility] complaining about SOB and fever. She continues to have hypoxia in spite of  3 Albuterol treatments, CBC, BMP and DDimers to be done, for Tele. Tamiflu started.         PLAN:  According to telephone report, this patient was accepted for transfer to [facility],   Under [facility] team:  [id number],  I have requested an order be written to call Flow Manager at [phone] upon patient arrival to the floor for final physician assignment who will do the admission and give admitting orders.  SIGNED: Clint Lipps, MD [facility]  02/25/2012, 4:24 PM  Addendum: Spoke again with Dr Rosalia Hammers, pt has positive D-Dimers, started on Lovenox, pt have CTA ASAP, either in [facility] or in [facility], which ever faster.  Clint Lipps Pager: [phone] 02/25/2012, 5:25 PM

## 2012-02-25 NOTE — Progress Notes (Signed)
Paged [facility] admission to make aware pt is here

## 2012-02-25 NOTE — ED Notes (Signed)
Pt with hx of asthma states that she believes she has influenza.  Pt reports body aches, chills, fever, nausea, diaphoresis.  Pt states that she has been taking nebs at home with no relief.  Shortness of breath reported at triage, ambulated from waiting room in nad.  Pt reports using large amount of albuterol, tachycardic, otherwise Vital signs stable, will continue to monitor.

## 2012-02-25 NOTE — ED Notes (Signed)
Patient states she just took Rizatripitan Benzoate 10mg  tablet (medication brought from home) for HA.

## 2012-02-25 NOTE — ED Notes (Signed)
[facility] at Bedside preparing for transport.

## 2012-02-25 NOTE — ED Provider Notes (Addendum)
History     CSN: [id number]  Arrival date & time 02/25/12  1159   First MD Initiated Contact with Patient 02/25/12 1204      Chief Complaint  Patient presents with  . Influenza    (Consider location/radiation/quality/duration/timing/severity/associated sxs/prior treatment) HPI Patient reports cough with sputum production, rhinorrhea, fever, sore throat, sneezing, nausea.  No vomiting or diarrhea.  Patient history of asthma and has dyspnea and cough with using albuterol 10 treatments since yesterday.  Patient did not have flu shot this year.  Dr. Archie Balboa. Patient with sick contacts.  Patient is smoker but has not smoked today.   Past Medical History  Diagnosis Date  . Asthma   . Seizure   . Protein C deficiency   . Protein S deficiency     Past Surgical History  Procedure Date  . Mouth surgery   . Dilation and curettage of uterus     History reviewed. No pertinent family history.  History  Substance Use Topics  . Smoking status: Current Every Day Smoker -- 0.5 packs/day    Types: Cigarettes  . Smokeless tobacco: Never Used  . Alcohol Use: No    OB History    Grav Para Term Preterm Abortions TAB SAB Ect Mult Living                  Review of Systems  All other systems reviewed and are negative.    Allergies  Aleve  Home Medications   Current Outpatient Rx  Name  Route  Sig  Dispense  Refill  . ALBUTEROL SULFATE HFA 108 (90 BASE) MCG/ACT IN AERS   Inhalation   Inhale 2 puffs into the lungs every 4 (four) hours as needed.         . ALBUTEROL IN   Inhalation   Inhale into the lungs as needed.         Marland Kitchen CARISOPRODOL 350 MG PO TABS   Oral   Take 350 mg by mouth 4 (four) times daily as needed.         Marland Kitchen HYDROCODONE-ACETAMINOPHEN 5-325 MG PO TABS   Oral   Take 2 tablets by mouth every 4 (four) hours as needed.         Marland Kitchen RIZATRIPTAN BENZOATE 10 MG PO TABS   Oral   Take 10 mg by mouth as needed. May repeat in 2 hours if needed            BP 132/86  Pulse 118  Temp 99 F (37.2 C) (Oral)  Resp 24  SpO2 94%  LMP 02/22/2012  Physical Exam  Nursing note and vitals reviewed. Constitutional: She is oriented to person, place, and time. She appears well-developed and well-nourished.  HENT:  Head: Normocephalic and atraumatic.  Eyes: Conjunctivae normal and EOM are normal. Pupils are equal, round, and reactive to light.  Neck: Normal range of motion. Neck supple.  Cardiovascular: Tachycardia present.   Pulmonary/Chest: She has wheezes.       Rhonchi right base greater than left base  Musculoskeletal: Normal range of motion.  Neurological: She is alert and oriented to person, place, and time.  Skin: Skin is warm and dry.  Psychiatric: She has a normal mood and affect. Her behavior is normal. Thought content normal.    ED Course  Procedures (including critical care time)  Labs Reviewed - No data to display No results found.   No diagnosis found.  Results for orders placed during the hospital encounter of 02/25/12  CBC WITH DIFFERENTIAL      Component Value Range   WBC 2.1 (*) 4.0 - 10.5 K/uL   RBC 3.65 (*) 3.87 - 5.11 MIL/uL   Hemoglobin 9.4 (*) 12.0 - 15.0 g/dL   HCT 78.2 (*) 95.6 - 21.3 %   MCV 81.6  78.0 - 100.0 fL   MCH 25.8 (*) 26.0 - 34.0 pg   MCHC 31.5  30.0 - 36.0 g/dL   RDW 08.6 (*) 57.8 - 46.9 %   Platelets 195  150 - 400 K/uL   Neutrophils Relative 81 (*) 43 - 77 %   Neutro Abs 1.7  1.7 - 7.7 K/uL   Lymphocytes Relative 13  12 - 46 %   Lymphs Abs 0.3 (*) 0.7 - 4.0 K/uL   Monocytes Relative 5  3 - 12 %   Monocytes Absolute 0.1  0.1 - 1.0 K/uL   Eosinophils Relative 1  0 - 5 %   Eosinophils Absolute 0.0  0.0 - 0.7 K/uL   Basophils Relative 0  0 - 1 %   Basophils Absolute 0.0  0.0 - 0.1 K/uL  D-DIMER, QUANTITATIVE      Component Value Range   D-Dimer, Quant 0.78 (*) 0.00 - 0.48 ug/mL-FEU     MDM  Patients symptoms are consistent with URI, likely viral etiology. Discussed that  antibiotics are not indicated for viral infections. Pt will be discharged with symptomatic treatment.  Verbalizes understanding and is agreeable with plan. Pt is hemodynamically stable & in NAD prior to dc. Patient with normal cxr and lungs cta after treatment x 1.  Plan prednisone.         Hilario Quarry, MD 02/25/12 1306  Hilario Quarry, MD 02/25/12 [phone]  Patient with decreased oxygen level when nurse attempted to d/c.  Sats remained 88% and patient placed on oxygen.  Sats improved on oxygen and third neb given.  When patient ambulated to bathroom again desaturated and rest sats at 90%.  Patient had labs sent, and tamiflu given.  Lungs remain without expiratory wheezes currently.   Hilario Quarry, MD 02/25/12 1607  Discussed with Dr. Arthor Captain and he notes patient previously anemic.  Discussed with patient and states she was diagnosed with iron deficiency anemia in 2011 but states has not had hemoglobin followed and does not take iron.  States continues to have heavy menses and was told due protein c and s deficiency.   D-dimer elevated at .78. Plan lovenox 1 mg/kg here prior to transfer.  Plan transfer and Dr. Arthor Captain recontacted.   Hilario Quarry, MD 02/25/12 1700  CRITICAL CARE Performed by: Hilario Quarry   Total critical care time: 45  Critical care time was exclusive of separately billable procedures and treating other patients.  Critical care was necessary to treat or prevent imminent or life-threatening deterioration.  Critical care was time spent personally by me on the following activities: development of treatment plan with patient and/or surrogate as well as nursing, discussions with consultants, evaluation of patient's response to treatment, examination of patient, obtaining history from patient or surrogate, ordering and performing treatments and interventions, ordering and review of laboratory studies, ordering and review of radiographic studies, pulse oximetry and  re-evaluation of patient's condition.   Hilario Quarry, MD 02/25/12 [phone]

## 2012-02-25 NOTE — H&P (Signed)
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  She has taken over 10 alb tx at home and still wheezing.  Transferred here from [facility] urgent care due to persistent hypoxia despite numerous alb tx.  No le edema or swelling.  No cp.  nonprod cough.  No n/v.  Flu neg.  Review of Systems:  O/w neg  Past Medical History: Past Medical History  Diagnosis Date  . Asthma   . Seizure   . Protein C deficiency   . Protein S deficiency    Past Surgical History  Procedure Date  . Mouth surgery   . Dilation and curettage of uterus     Medications: Prior to Admission medications   Medication Sig Start Date End Date Taking? Authorizing Provider  albuterol (PROVENTIL HFA;VENTOLIN HFA) 108 (90 BASE) MCG/ACT inhaler Inhale 2 puffs into the lungs every 4 (four) hours as needed. For wheezing   Yes Historical Provider, MD  carisoprodol (SOMA) 350 MG tablet Take 350 mg by mouth 4 (four) times daily as needed. For pain   Yes Historical Provider, MD  clonazePAM (KLONOPIN) 2 MG tablet Take 2 mg by mouth at bedtime.   Yes Historical Provider, MD  HYDROcodone-acetaminophen (NORCO/VICODIN) 5-325 MG per tablet Take 2 tablets by mouth every 4 (four) hours as needed. For pain   Yes Historical Provider, MD  promethazine (PHENERGAN) 25 MG tablet Take 25 mg by mouth every 6 (six) hours as needed. For nausea   Yes Historical Provider, MD  rizatriptan (MAXALT) 10 MG tablet Take 10 mg by mouth as needed. May repeat in 2 hours if needed for migraines   Yes Historical Provider, MD  ondansetron (ZOFRAN ODT) 8 MG disintegrating tablet Take 1 tablet (8 mg total) by mouth every 8 (eight) hours as needed for nausea. 02/25/12   Hilario Quarry, MD  predniSONE (DELTASONE) 10 MG tablet Take 2 tablets (20 mg total) by mouth daily. 02/25/12   Hilario Quarry, MD     Allergies:   Allergies  Allergen Reactions  . Aleve (Naproxen Sodium) Nausea And Vomiting    Burns stomach    Social History:  reports that she has been smoking Cigarettes.  She has been smoking about .5 packs per day. She has never used smokeless tobacco. She reports that she does not drink alcohol or use illicit drugs.  Family History: History reviewed. No pertinent family history.  Physical Exam: Filed Vitals:   02/25/12 1521 02/25/12 1726 02/25/12 1817 02/25/12 1834  BP:   131/84   Pulse:   112   Temp:   99.2 F (37.3 C)   TempSrc:   Oral   Resp:      Weight:  60.782 kg (134 lb)    SpO2: 94%  91% 93%   General appearance: alert, cooperative and no distress Neck: no JVD and supple, symmetrical, trachea midline Lungs: wheezes bilaterally Heart: regular rate and rhythm, S1, S2 normal, no murmur, click, rub or gallop Abdomen: soft, non-tender; bowel sounds normal; no masses,  no organomegaly Extremities: extremities normal, atraumatic, no cyanosis or edema Pulses: 2+ and symmetric Skin: Skin color, texture, turgor normal. No rashes or lesions Neurologic: Grossly normal    Labs on Admission:   Basename 02/25/12 1620  NA 143  K 3.0*  CL 107  CO2 19  GLUCOSE 136*  BUN 6  CREATININE 0.50  CALCIUM 8.5  MG --  PHOS --    Basename 02/25/12 1620  WBC 2.1*  NEUTROABS 1.7  HGB 9.4*  HCT 29.8*  MCV 81.6  PLT 195   Radiological Exams on Admission: Dg Chest 2 View  02/25/2012  *RADIOLOGY REPORT*  Clinical Data: Cough  CHEST - 2 VIEW  Comparison: 03/25/2006  Findings: Cardiomediastinal silhouette is stable.  No acute infiltrate or pleural effusion.  No pulmonary edema.  Bony thorax is unremarkable.  Central mild bronchitic changes.  IMPRESSION: No acute infiltrate or pulmonary edema.  Central mild bronchitic changes.   Original Report Authenticated By: Natasha Mead, M.D.     Assessment/Plan  32 yo female asthma exac, hypoxia and flu like illness Principal  Problem:  *Respiratory failure with hypoxia Active Problems:  Influenza-like illness  Acute asthma exacerbation  Protein S deficiency  Protein C deficiency  Tele.  Iv solumedrol.  freq nebs.  Zpack.  Cta is pending to r/o PE but doubt PE, symptoms c/w viral upper resp infection.  Oxygen support prn.  Full code.  Velena Keegan A 02/25/2012, 8:09 PM

## 2012-02-25 NOTE — ED Notes (Signed)
MD at bedside. 

## 2012-02-25 NOTE — ED Notes (Signed)
Report given to Josh, RN [facility] 

## 2012-02-25 NOTE — ED Notes (Signed)
Report given to [place], RN [facility] Room [phone].

## 2012-02-25 NOTE — ED Notes (Signed)
Crystal, RTT at bedside.

## 2012-02-26 DIAGNOSIS — R791 Abnormal coagulation profile: Secondary | ICD-10-CM

## 2012-02-26 LAB — BASIC METABOLIC PANEL
BUN: 8 mg/dL (ref 6–23)
CO2: 23 mEq/L (ref 19–32)
Calcium: 8.6 mg/dL (ref 8.4–10.5)
Creatinine, Ser: 0.42 mg/dL — ABNORMAL LOW (ref 0.50–1.10)
Glucose, Bld: 115 mg/dL — ABNORMAL HIGH (ref 70–99)

## 2012-02-26 LAB — CBC
HCT: 25.8 % — ABNORMAL LOW (ref 36.0–46.0)
MCH: 25.8 pg — ABNORMAL LOW (ref 26.0–34.0)
MCV: 79.4 fL (ref 78.0–100.0)
Platelets: 172 10*3/uL (ref 150–400)
RDW: 18.9 % — ABNORMAL HIGH (ref 11.5–15.5)

## 2012-02-26 MED ORDER — POLYSACCHARIDE IRON COMPLEX 150 MG PO CAPS: 150.0000 mg | ORAL_CAPSULE | Freq: Every day | ORAL | Status: DC

## 2012-02-26 MED ORDER — GUAIFENESIN-DM 100-10 MG/5ML PO SYRP: 5.0000 mL | ORAL_SOLUTION | ORAL | Status: DC | PRN

## 2012-02-26 MED ORDER — PNEUMOCOCCAL VAC POLYVALENT 25 MCG/0.5ML IJ INJ
0.5000 mL | INJECTION | INTRAMUSCULAR | Status: AC
  Administered 2012-02-26: 0.5 mL via INTRAMUSCULAR
  Filled 2012-02-26: qty 0.5

## 2012-02-26 MED ORDER — POLYSACCHARIDE IRON COMPLEX 150 MG PO CAPS
150.0000 mg | ORAL_CAPSULE | Freq: Every day | ORAL | Status: DC
  Administered 2012-02-26: 150 mg via ORAL
  Filled 2012-02-26: qty 1

## 2012-02-26 MED ORDER — AZITHROMYCIN 250 MG PO TABS: ORAL_TABLET | ORAL | Status: DC

## 2012-02-26 MED ORDER — INFLUENZA VIRUS VACC SPLIT PF IM SUSP
0.5000 mL | Freq: Once | INTRAMUSCULAR | Status: AC
  Administered 2012-02-26: 0.5 mL via INTRAMUSCULAR
  Filled 2012-02-26: qty 0.5

## 2012-02-26 MED ORDER — KETOROLAC TROMETHAMINE 30 MG/ML IJ SOLN
30.0000 mg | Freq: Once | INTRAMUSCULAR | Status: AC
  Administered 2012-02-26: 30 mg via INTRAVENOUS
  Filled 2012-02-26: qty 1

## 2012-02-26 MED ORDER — PREDNISONE 10 MG PO TABS: 50.0000 mg | ORAL_TABLET | Freq: Every day | ORAL | Status: DC

## 2012-02-26 NOTE — Discharge Summary (Signed)
Addendum  Patient seen and examined, chart and data base reviewed.  I agree with the above assessment and plan.  For full details please see Mrs. Algis Downs PA. Note.  D/C on prednisone taper and Azithromycin.   Clint Lipps, MD [facility] Pager: [phone] 02/26/2012, 4:44 PM

## 2012-02-26 NOTE — Progress Notes (Signed)
Pt. discharged to floor,verbalized understanding of discharged instruction,medication,restriction,diet and follow up appointment.Baseline Vitals sign stable,Pt comfortable,no sign and symptom of distress. 

## 2012-02-26 NOTE — Progress Notes (Signed)
Ambulate pt at the hallway as ordered,Sao2 ranges from 90-92% at room air,per pt she is little short of breath but tolerable.

## 2012-02-26 NOTE — Discharge Summary (Signed)
Physician Discharge Summary  Kerry Duncan JXB:[id number] DOB: November 13, 1980 DOA: 02/25/2012  PCP: Garth Schlatter, MD  Admit date: 02/25/2012 Discharge date: 02/26/2012  Time spent: 40 minutes  Recommendations for Outpatient Follow-up:   Please evaluate patient's respiratory status and asthma medications.  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 She has taken over 10 alb tx at home and still wheezing. Transferred here from [facility] urgent care due to persistent hypoxia despite numerous alb tx. No le edema or swelling. No cp. nonprod cough. No n/v. Flu neg.   Hospital Course:  The patient was treated with scheduled nebulizers, IV steroids, azithromycin and oxygen support. Her influenza PCR panel was negative. When I examined her on January 27 the morning after admission, she was in no respiratory distress. She was no longer using her oxygen. She did appear croupy. She was stable.  It was jointly decided with the patient and M.D. that she could rest and recuperate at home.  She was discharged on a prednisone taper with a short course of steroids. She will continue to use her nebulizers at home as she has previously. She's been advised to followup with Dr. Ivory Broad regarding her breathing within the next 7-10 days.   Discharge Exam: Filed Vitals:   02/26/12 0218 02/26/12 0424 02/26/12 0925  02/26/12 1341  BP:  126/85    Pulse:  95    Temp:  97.8 F (36.6 C)    TempSrc:  Oral    Resp:  18    Height:      Weight:      SpO2: 89% 92% 93% 90%    General: Alert and oriented, sitting up in bed, using her Ipad, she has taken her oxygen off, nontoxic Cardiovascular: Regular rate and rhythm, no murmurs rubs or gallops Respiratory: Croupy sounding rhonchi, with nonproductive cough, no accessory muscle use Abdomen: Thin, nontender, nondistended, positive bowel sounds, no masses Extremities: Able to move all 4 extremities, symmetric strength, no edema Skin: No rashes bruises or lesions Psych: Alert and oriented x3, cooperative, appropriately groomed, appropriate  Discharge Instructions  Discharge Orders    Future Orders Please Complete By Expires   Diet - low sodium heart healthy      Increase activity slowly          Medication List     As of 02/26/2012  3:55 PM    TAKE these medications         albuterol 108 (90 BASE) MCG/ACT inhaler   Commonly known as: PROVENTIL HFA;VENTOLIN HFA   Inhale 2 puffs into the lungs every 4 (four) hours as needed. For wheezing      azithromycin 250 MG tablet   Commonly known as: ZITHROMAX   Take on tablet daily starting 1/28      carisoprodol 350 MG tablet   Commonly known as: SOMA   Take 350 mg by mouth 4 (four) times daily as needed. For pain  clonazePAM 2 MG tablet   Commonly known as: KLONOPIN   Take 2 mg by mouth at bedtime.      guaiFENesin-dextromethorphan 100-10 MG/5ML syrup   Commonly known as: ROBITUSSIN DM   Take 5 mLs by mouth every 4 (four) hours as needed for cough.      HYDROcodone-acetaminophen 5-325 MG per tablet   Commonly known as: NORCO/VICODIN   Take 2 tablets by mouth every 4 (four) hours as needed. For pain      iron polysaccharides 150 MG capsule   Commonly known as: NIFEREX   Take 1 capsule (150 mg total) by mouth daily.      ondansetron 8 MG disintegrating tablet   Commonly known as:  ZOFRAN-ODT   Take 1 tablet (8 mg total) by mouth every 8 (eight) hours as needed for nausea.      predniSONE 10 MG tablet   Commonly known as: DELTASONE   Take 5 tablets (50 mg total) by mouth daily. Take 5 tablets with breakfast on 1/28.  Decrease by 1 tablet daily until gone.  (4 tablets on 1/29, 3 tablets on 1/30, etc...)      promethazine 25 MG tablet   Commonly known as: PHENERGAN   Take 25 mg by mouth every 6 (six) hours as needed. For nausea      rizatriptan 10 MG tablet   Commonly known as: MAXALT   Take 10 mg by mouth as needed. May repeat in 2 hours if needed for migraines           Follow-up Information    Follow up with [facility], Joylene John, MD. Schedule an appointment as soon as possible for a visit in 1 week.   Contact information:   [facility] [address] [place] [address] [phone]           The results of significant diagnostics from this hospitalization (including imaging, microbiology, ancillary and laboratory) are listed below for reference.    Significant Diagnostic Studies: Dg Chest 2 View  02/25/2012  *RADIOLOGY REPORT*  Clinical Data: Cough  CHEST - 2 VIEW  Comparison: 03/25/2006  Findings: Cardiomediastinal silhouette is stable.  No acute infiltrate or pleural effusion.  No pulmonary edema.  Bony thorax is unremarkable.  Central mild bronchitic changes.  IMPRESSION: No acute infiltrate or pulmonary edema.  Central mild bronchitic changes.   Original Report Authenticated By: Natasha Mead, M.D.    Ct Angio Chest Pe W/cm &/or Wo Cm  02/25/2012  *RADIOLOGY REPORT*  Clinical Data: Cough, chest soreness.  CT ANGIOGRAPHY CHEST  Technique:  Multidetector CT imaging of the chest using the standard protocol during bolus administration of intravenous contrast. Multiplanar reconstructed images including MIPs were obtained and reviewed to evaluate the vascular anatomy.  Contrast: OMNIPAQUE IOHEXOL 350 MG/ML SOLN  Comparison:  02/25/2012 radiograph, 03/22/2006 CT  Findings: There are respiratory degraded images, which limits evaluation of the mid to lower lungs and branch vessels.  No pulmonary arterial filling defect identified within the adequately imaged branch vessels.  Normal caliber aorta.  Normal heart size. No pleural or pericardial effusion. No intrathoracic lymphadenopathy.  Central airways are patent.  Mild streaky opacities within the middle lobe and lingula.  No pneumothorax.  No acute osseous finding.  Limited images through the upper abdomen show no acute finding.  IMPRESSION: Motion degraded without pulmonary embolism identified.  There are streaky opacities within the middle lobe and lingula, which may reflect an infectious process.   Original  Report Authenticated By: Jearld Lesch, M.D.    Labs: Basic Metabolic Panel:  Lab 02/26/12 4098 02/25/12 2230 02/25/12 1620  NA 140 -- 143  K 4.1 -- 3.0*  CL 107 -- 107  CO2 23 -- 19  GLUCOSE 115* -- 136*  BUN 8 -- 6  CREATININE 0.42* 0.44* 0.50  CALCIUM 8.6 -- 8.5  MG -- 2.0 --  PHOS -- -- --   CBC:  Lab 02/26/12 0625 02/25/12 2230 02/25/12 1620  WBC 2.1* 2.0* 2.1*  NEUTROABS -- -- 1.7  HGB 8.4* 8.9* 9.4*  HCT 25.8* 27.6* 29.8*  MCV 79.4 80.0 81.6  PLT 172 172 195    Signed:  Conley Canal [phone]  [facility] 02/26/2012, 3:55 PM

## 2012-02-28 NOTE — Care Management Note (Signed)
    Page 1 of 1   02/28/2012     5:17:14 PM   CARE MANAGEMENT NOTE 02/28/2012  Patient:  Kerry Duncan, Kerry Duncan   Account Number:  [id number]  Date Initiated:  02/28/2012  Documentation initiated by:  Letha Cape  Subjective/Objective Assessment:   dx asthma, bronchitis  admit as observation- lives with spouse.  pta independent.     Action/Plan:   Anticipated DC Date:  02/26/2012   Anticipated DC Plan:  HOME/SELF CARE      DC Planning Services  CM consult      Choice offered to / List presented to:             Status of service:  Completed, signed off Medicare Important Message given?   (If response is "NO", the following Medicare IM given date fields will be blank) Date Medicare IM given:   Date Additional Medicare IM given:    Discharge Disposition:  HOME/SELF CARE  Per UR Regulation:  Reviewed for med. necessity/level of care/duration of stay  If discussed at Long Length of Stay Meetings, dates discussed:    Comments:

## 2012-05-12 ENCOUNTER — Encounter: Payer: Self-pay | Admitting: Oncology

## 2012-07-26 ENCOUNTER — Emergency Department (HOSPITAL_COMMUNITY): Payer: 59

## 2012-07-26 ENCOUNTER — Encounter (HOSPITAL_COMMUNITY): Payer: Self-pay | Admitting: *Deleted

## 2012-07-26 ENCOUNTER — Emergency Department (HOSPITAL_COMMUNITY)
Admission: EM | Admit: 2012-07-26 | Discharge: 2012-07-26 | Disposition: A | Discharge status: Home / Self Care | Payer: 59 | Attending: Emergency Medicine | Admitting: Emergency Medicine

## 2012-07-26 DIAGNOSIS — J45909 Unspecified asthma, uncomplicated: Secondary | ICD-10-CM | POA: Insufficient documentation

## 2012-07-26 DIAGNOSIS — IMO0002 Reserved for concepts with insufficient information to code with codable children: Secondary | ICD-10-CM | POA: Insufficient documentation

## 2012-07-26 DIAGNOSIS — F17200 Nicotine dependence, unspecified, uncomplicated: Secondary | ICD-10-CM | POA: Insufficient documentation

## 2012-07-26 DIAGNOSIS — Z8639 Personal history of other endocrine, nutritional and metabolic disease: Secondary | ICD-10-CM | POA: Insufficient documentation

## 2012-07-26 DIAGNOSIS — Z3202 Encounter for pregnancy test, result negative: Secondary | ICD-10-CM | POA: Insufficient documentation

## 2012-07-26 DIAGNOSIS — R569 Unspecified convulsions: Secondary | ICD-10-CM

## 2012-07-26 DIAGNOSIS — F29 Unspecified psychosis not due to a substance or known physiological condition: Secondary | ICD-10-CM | POA: Insufficient documentation

## 2012-07-26 DIAGNOSIS — R413 Other amnesia: Secondary | ICD-10-CM | POA: Insufficient documentation

## 2012-07-26 DIAGNOSIS — Z862 Personal history of diseases of the blood and blood-forming organs and certain disorders involving the immune mechanism: Secondary | ICD-10-CM | POA: Insufficient documentation

## 2012-07-26 DIAGNOSIS — G40909 Epilepsy, unspecified, not intractable, without status epilepticus: Secondary | ICD-10-CM | POA: Insufficient documentation

## 2012-07-26 DIAGNOSIS — Z79899 Other long term (current) drug therapy: Secondary | ICD-10-CM | POA: Insufficient documentation

## 2012-07-26 DIAGNOSIS — R296 Repeated falls: Secondary | ICD-10-CM | POA: Insufficient documentation

## 2012-07-26 DIAGNOSIS — S0003XA Contusion of scalp, initial encounter: Secondary | ICD-10-CM | POA: Insufficient documentation

## 2012-07-26 DIAGNOSIS — Y929 Unspecified place or not applicable: Secondary | ICD-10-CM | POA: Insufficient documentation

## 2012-07-26 DIAGNOSIS — M545 Low back pain: Secondary | ICD-10-CM

## 2012-07-26 DIAGNOSIS — Y9389 Activity, other specified: Secondary | ICD-10-CM | POA: Insufficient documentation

## 2012-07-26 LAB — BASIC METABOLIC PANEL
BUN: 8 mg/dL (ref 6–23)
Chloride: 106 mEq/L (ref 96–112)
GFR calc Af Amer: >90 mL/min (ref >90)
Potassium: 3.5 mEq/L (ref 3.5–5.1)
Sodium: 141 mEq/L (ref 135–145)

## 2012-07-26 LAB — URINALYSIS, ROUTINE W REFLEX MICROSCOPIC
Leukocytes, UA: NEGATIVE
Nitrite: NEGATIVE
Specific Gravity, Urine: 1.025 (ref 1.005–1.030)
pH: 5.5 (ref 5.0–8.0)

## 2012-07-26 LAB — CBC WITH DIFFERENTIAL/PLATELET
Basophils Absolute: 0 10*3/uL (ref 0.0–0.1)
Basophils Relative: 0 % (ref 0–1)
Hemoglobin: 9.1 g/dL — ABNORMAL LOW (ref 12.0–15.0)
MCHC: 32.3 g/dL (ref 30.0–36.0)
Monocytes Relative: 6 % (ref 3–12)
Neutro Abs: 7.9 10*3/uL — ABNORMAL HIGH (ref 1.7–7.7)
Neutrophils Relative %: 76 % (ref 43–77)
Platelets: 234 10*3/uL (ref 150–400)

## 2012-07-26 MED ORDER — MORPHINE SULFATE 4 MG/ML IJ SOLN
4.0000 mg | Freq: Once | INTRAMUSCULAR | Status: AC
  Administered 2012-07-26: 4 mg via INTRAVENOUS
  Filled 2012-07-26: qty 1

## 2012-07-26 MED ORDER — SODIUM CHLORIDE 0.9 % IV BOLUS (SEPSIS)
1000.0000 mL | Freq: Once | INTRAVENOUS | Status: AC
  Administered 2012-07-26: 1000 mL via INTRAVENOUS

## 2012-07-26 NOTE — ED Notes (Signed)
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 Family states that the pt fell down/ passed out and appeared to have seizure like activity for less than a minute. Spinal cord assessment was negative per EMS, VSS, pt is alert and oriented. Only compliants at this time are back pain and some nausea. EMS started a 20g in her left hand and pt was given Zofran 4mg  IV by EMS.

## 2012-07-26 NOTE — ED Provider Notes (Signed)
Medical screening examination/treatment/procedure(s) were performed by non-physician practitioner and as supervising physician I was immediately available for consultation/collaboration.   Eames Dibiasio, MD 07/26/12 2302 

## 2012-07-26 NOTE — ED Notes (Signed)
Bed:WA11<BR> Expected date:<BR> Expected time:<BR> Means of arrival:<BR> Comments:<BR> EMS

## 2012-07-26 NOTE — ED Provider Notes (Signed)
History    CSN: [id number] Arrival date & time 07/26/12  1331  First MD Initiated Contact with Patient 07/26/12 1512     Chief Complaint  Patient presents with  . Seizures   (Consider location/radiation/quality/duration/timing/severity/associated sxs/prior Treatment) Patient is a 32 y.o. female presenting with seizures. The history is provided by the patient. No language interpreter was used.  Seizures Seizure activity on arrival: no   Seizure type:  Grand mal Postictal symptoms: memory loss   Return to baseline: yes   Number of seizures this episode:  1 Context comment:  She reports LOC with seizure activity lasting about 1 minute. The patient has no memory of event and states she returned to her pre-seizure activity without being aware of any events. History of seizures: yes (She has had history of one seizure in the past with unknown cause.)    Past Medical History  Diagnosis Date  . Asthma   . Seizure   . Protein C deficiency   . Protein S deficiency    Past Surgical History  Procedure Laterality Date  . Mouth surgery    . Dilation and curettage of uterus     History reviewed. No pertinent family history. History  Substance Use Topics  . Smoking status: Current Every Day Smoker -- 0.50 packs/day    Types: Cigarettes  . Smokeless tobacco: Never Used  . Alcohol Use: No   OB History   Grav Para Term Preterm Abortions TAB SAB Ect Mult Living                 Review of Systems  Constitutional: Negative for fever.  HENT: Negative for neck pain.   Respiratory: Negative for shortness of breath.   Cardiovascular: Negative for chest pain.  Gastrointestinal: Negative for nausea and abdominal pain.  Genitourinary: Negative for dysuria and vaginal bleeding.  Musculoskeletal: Positive for back pain.  Neurological: Positive for seizures and headaches.  Psychiatric/Behavioral: Positive for confusion.    Allergies  Aleve  Home Medications   Current Outpatient Rx   Name  Route  Sig  Dispense  Refill  . albuterol (PROVENTIL HFA;VENTOLIN HFA) 108 (90 BASE) MCG/ACT inhaler   Inhalation   Inhale 2 puffs into the lungs every 4 (four) hours as needed. For wheezing         . ALPRAZolam (XANAX) 1 MG tablet   Oral   Take 1 mg by mouth See admin instructions. Take 2 mg twice daily  As needed for anxiety or sleep         . Aspirin-Acetaminophen-Caffeine (GOODY HEADACHE PO)   Oral   Take 1 packet by mouth daily as needed (headache).         . carisoprodol (SOMA) 350 MG tablet   Oral   Take 350 mg by mouth 4 (four) times daily as needed. For pain         . promethazine (PHENERGAN) 25 MG tablet   Oral   Take 25 mg by mouth every 6 (six) hours as needed. For nausea         . rizatriptan (MAXALT) 10 MG tablet   Oral   Take 10 mg by mouth as needed. May repeat in 2 hours if needed for migraines         . traMADol (ULTRAM) 50 MG tablet   Oral   Take 50 mg by mouth every 6 (six) hours as needed for pain.          Pulse 100  Temp(Src)  98.8 F (37.1 C) (Oral)  Resp 16  SpO2 100%  LMP 07/12/2012 Physical Exam  Constitutional: She is oriented to person, place, and time. She appears well-developed and well-nourished.  HENT:  Head: Normocephalic.  Hematoma to left parietal scalp without wound.  Eyes: Pupils are equal, round, and reactive to light.  Neck: Normal range of motion. Neck supple.  Cardiovascular: Normal rate and regular rhythm.   Pulmonary/Chest: Effort normal and breath sounds normal.  Abdominal: Soft. Bowel sounds are normal. There is no tenderness. There is no rebound and no guarding.  Musculoskeletal: Normal range of motion. She exhibits no edema.  Neurological: She is alert and oriented to person, place, and time. She has normal strength and normal reflexes. No cranial nerve deficit or sensory deficit. She displays a negative Romberg sign. Coordination normal.  Skin: Skin is warm and dry. No rash noted.  Psychiatric: She  has a normal mood and affect.    ED Course  Procedures (including critical care time) Labs Reviewed - No data to display No results found. Results for orders placed during the hospital encounter of 07/26/12  CBC WITH DIFFERENTIAL      Result Value Range   WBC 10.4  4.0 - 10.5 K/uL   RBC 3.53 (*) 3.87 - 5.11 MIL/uL   Hemoglobin 9.1 (*) 12.0 - 15.0 g/dL   HCT 16.1 (*) 09.6 - 04.5 %   MCV 79.9  78.0 - 100.0 fL   MCH 25.8 (*) 26.0 - 34.0 pg   MCHC 32.3  30.0 - 36.0 g/dL   RDW 40.9 (*) 81.1 - 91.4 %   Platelets 234  150 - 400 K/uL   Neutrophils Relative % 76  43 - 77 %   Neutro Abs 7.9 (*) 1.7 - 7.7 K/uL   Lymphocytes Relative 16  12 - 46 %   Lymphs Abs 1.6  0.7 - 4.0 K/uL   Monocytes Relative 6  3 - 12 %   Monocytes Absolute 0.6  0.1 - 1.0 K/uL   Eosinophils Relative 2  0 - 5 %   Eosinophils Absolute 0.2  0.0 - 0.7 K/uL   Basophils Relative 0  0 - 1 %   Basophils Absolute 0.0  0.0 - 0.1 K/uL  BASIC METABOLIC PANEL      Result Value Range   Sodium 141  135 - 145 mEq/L   Potassium 3.5  3.5 - 5.1 mEq/L   Chloride 106  96 - 112 mEq/L   CO2 26  19 - 32 mEq/L   Glucose, Bld 79  70 - 99 mg/dL   BUN 8  6 - 23 mg/dL   Creatinine, Ser 7.82  0.50 - 1.10 mg/dL   Calcium 8.4  8.4 - 95.6 mg/dL   GFR calc non Af Amer >90  >90 mL/min   GFR calc Af Amer >90  >90 mL/min  URINALYSIS, ROUTINE W REFLEX MICROSCOPIC      Result Value Range   Color, Urine YELLOW  YELLOW   APPearance CLOUDY (*) CLEAR   Specific Gravity, Urine 1.025  1.005 - 1.030   pH 5.5  5.0 - 8.0   Glucose, UA NEGATIVE  NEGATIVE mg/dL   Hgb urine dipstick NEGATIVE  NEGATIVE   Bilirubin Urine NEGATIVE  NEGATIVE   Ketones, ur NEGATIVE  NEGATIVE mg/dL   Protein, ur NEGATIVE  NEGATIVE mg/dL   Urobilinogen, UA 0.2  0.0 - 1.0 mg/dL   Nitrite NEGATIVE  NEGATIVE   Leukocytes, UA NEGATIVE  NEGATIVE  PREGNANCY, URINE      Result Value Range   Preg Test, Ur NEGATIVE  NEGATIVE   Dg Lumbar Spine Complete  07/26/2012   *RADIOLOGY  REPORT*  Clinical Data: Seizure today, fall, low back and tail bone pain  LUMBAR SPINE - COMPLETE 4+ VIEW  Comparison: None  Findings: Osseous mineralization normal. Five non-rib bearing lumbar vertebrae. Vertebral body and disc space heights maintained. No acute fracture, subluxation or bone destruction. Visualized sacrum and coccyx intact. SI joints symmetric.  IMPRESSION: No acute osseous abnormalities.   Original Report Authenticated By: Ulyses Southward, M.D.   Ct Head Wo Contrast  07/26/2012   *RADIOLOGY REPORT*  Clinical Data: Seizure, past history of seizures  CT HEAD WITHOUT CONTRAST  Technique:  Contiguous axial images were obtained from the base of the skull through the vertex without contrast.  Comparison: 08/14/2009  Findings: Normal ventricular morphology. No midline shift or mass effect. Normal appearance of brain parenchyma. No intracranial hemorrhage, mass lesion, or acute infarction. Visualized paranasal sinuses and mastoid air cells clear. Bones unremarkable.  IMPRESSION: No acute intracranial abnormalities.   Original Report Authenticated By: Ulyses Southward, M.D.   No diagnosis found. 1. Seizure 2. Scalp contusion 3. Back pain after fall MDM  No seizure activity in ED. Heat CT, lab studies essentially negative for cause. Patient reports sleep deprivation pattern over the last 3 nights - query source of seizure activity. Pain managed. Discussed with Dr. Fredderick Phenix. Stable for discharge.   Arnoldo Hooker, PA-C 07/26/12 1837

## 2012-07-26 NOTE — ED Notes (Signed)
Pt states that she was helping out with a family members wedding when she fell down/passed out and family members state that she had seizure like activity for less than a minute. Pt states that she hit her head and jaw and is still currently having some lower back pain. Pt is alert and oriented at this time.Marland Kitchen

## 2014-02-27 ENCOUNTER — Emergency Department (HOSPITAL_COMMUNITY): Payer: 59

## 2014-02-27 ENCOUNTER — Emergency Department (HOSPITAL_COMMUNITY)
Admission: EM | Admit: 2014-02-27 | Discharge: 2014-02-27 | Disposition: A | Discharge status: Home / Self Care | Payer: 59 | Attending: Emergency Medicine | Admitting: Emergency Medicine

## 2014-02-27 ENCOUNTER — Encounter (HOSPITAL_COMMUNITY): Payer: Self-pay

## 2014-02-27 DIAGNOSIS — Y998 Other external cause status: Secondary | ICD-10-CM | POA: Insufficient documentation

## 2014-02-27 DIAGNOSIS — Y9389 Activity, other specified: Secondary | ICD-10-CM | POA: Insufficient documentation

## 2014-02-27 DIAGNOSIS — J45909 Unspecified asthma, uncomplicated: Secondary | ICD-10-CM | POA: Diagnosis not present

## 2014-02-27 DIAGNOSIS — Z79899 Other long term (current) drug therapy: Secondary | ICD-10-CM | POA: Insufficient documentation

## 2014-02-27 DIAGNOSIS — Y9289 Other specified places as the place of occurrence of the external cause: Secondary | ICD-10-CM | POA: Insufficient documentation

## 2014-02-27 DIAGNOSIS — X58XXXA Exposure to other specified factors, initial encounter: Secondary | ICD-10-CM | POA: Insufficient documentation

## 2014-02-27 DIAGNOSIS — F1310 Sedative, hypnotic or anxiolytic abuse, uncomplicated: Secondary | ICD-10-CM | POA: Diagnosis not present

## 2014-02-27 DIAGNOSIS — F1110 Opioid abuse, uncomplicated: Secondary | ICD-10-CM | POA: Insufficient documentation

## 2014-02-27 DIAGNOSIS — Z720 Tobacco use: Secondary | ICD-10-CM | POA: Insufficient documentation

## 2014-02-27 DIAGNOSIS — T39011A Poisoning by aspirin, accidental (unintentional), initial encounter: Secondary | ICD-10-CM | POA: Diagnosis present

## 2014-02-27 DIAGNOSIS — Z862 Personal history of diseases of the blood and blood-forming organs and certain disorders involving the immune mechanism: Secondary | ICD-10-CM | POA: Diagnosis not present

## 2014-02-27 DIAGNOSIS — Z8669 Personal history of other diseases of the nervous system and sense organs: Secondary | ICD-10-CM | POA: Diagnosis not present

## 2014-02-27 DIAGNOSIS — T50901A Poisoning by unspecified drugs, medicaments and biological substances, accidental (unintentional), initial encounter: Secondary | ICD-10-CM

## 2014-02-27 LAB — CBC WITH DIFFERENTIAL/PLATELET
BASOS PCT: 0 % (ref 0–1)
Basophils Absolute: 0 10*3/uL (ref 0.0–0.1)
Eosinophils Absolute: 0.1 10*3/uL (ref 0.0–0.7)
Eosinophils Relative: 1 % (ref 0–5)
HEMATOCRIT: 26.3 % — AB (ref 36.0–46.0)
HEMOGLOBIN: 7.8 g/dL — AB (ref 12.0–15.0)
LYMPHS ABS: 1.3 10*3/uL (ref 0.7–4.0)
Lymphocytes Relative: 16 % (ref 12–46)
MCH: 21.6 pg — ABNORMAL LOW (ref 26.0–34.0)
MCHC: 29.7 g/dL — AB (ref 30.0–36.0)
MCV: 72.9 fL — AB (ref 78.0–100.0)
MONO ABS: 0.6 10*3/uL (ref 0.1–1.0)
Monocytes Relative: 7 % (ref 3–12)
NEUTROS ABS: 6.1 10*3/uL (ref 1.7–7.7)
NEUTROS PCT: 76 % (ref 43–77)
PLATELETS: 339 10*3/uL (ref 150–400)
RBC: 3.61 MIL/uL — ABNORMAL LOW (ref 3.87–5.11)
RDW: 21.9 % — AB (ref 11.5–15.5)
WBC: 8.1 10*3/uL (ref 4.0–10.5)

## 2014-02-27 LAB — URINALYSIS, ROUTINE W REFLEX MICROSCOPIC
Bilirubin Urine: NEGATIVE
Glucose, UA: NEGATIVE mg/dL
HGB URINE DIPSTICK: NEGATIVE
Ketones, ur: NEGATIVE mg/dL
LEUKOCYTES UA: NEGATIVE
Nitrite: NEGATIVE
PROTEIN: 30 mg/dL — AB
Specific Gravity, Urine: 1.029 (ref 1.005–1.030)
Urobilinogen, UA: 0.2 mg/dL (ref 0.0–1.0)
pH: 5.5 (ref 5.0–8.0)

## 2014-02-27 LAB — COMPREHENSIVE METABOLIC PANEL
ALBUMIN: 3.7 g/dL (ref 3.5–5.2)
ALT: 14 U/L (ref 0–35)
ANION GAP: 6 (ref 5–15)
AST: 20 U/L (ref 0–37)
Alkaline Phosphatase: 33 U/L — ABNORMAL LOW (ref 39–117)
BILIRUBIN TOTAL: 0.1 mg/dL — AB (ref 0.3–1.2)
BUN: 14 mg/dL (ref 6–23)
CHLORIDE: 111 mmol/L (ref 96–112)
CO2: 20 mmol/L (ref 19–32)
Calcium: 7.9 mg/dL — ABNORMAL LOW (ref 8.4–10.5)
Creatinine, Ser: 0.74 mg/dL (ref 0.50–1.10)
GLUCOSE: 84 mg/dL (ref 70–99)
Potassium: 3.4 mmol/L — ABNORMAL LOW (ref 3.5–5.1)
Sodium: 137 mmol/L (ref 135–145)
Total Protein: 6.7 g/dL (ref 6.0–8.3)

## 2014-02-27 LAB — PROTIME-INR
INR: 1.11 (ref 0.00–1.49)
PROTHROMBIN TIME: 14.5 s (ref 11.6–15.2)

## 2014-02-27 LAB — RAPID URINE DRUG SCREEN, HOSP PERFORMED
AMPHETAMINES: NOT DETECTED
BENZODIAZEPINES: POSITIVE — AB
Barbiturates: NOT DETECTED
Cocaine: NOT DETECTED
Opiates: POSITIVE — AB
Tetrahydrocannabinol: NOT DETECTED

## 2014-02-27 LAB — TYPE AND SCREEN
ABO/RH(D): A NEG
Antibody Screen: NEGATIVE

## 2014-02-27 LAB — ETHANOL

## 2014-02-27 LAB — APTT: APTT: 32 s (ref 24–37)

## 2014-02-27 LAB — URINE MICROSCOPIC-ADD ON

## 2014-02-27 LAB — SALICYLATE LEVEL: SALICYLATE LVL: 25.9 mg/dL — AB (ref 2.8–20.0)

## 2014-02-27 LAB — I-STAT BETA HCG BLOOD, ED (MC, WL, AP ONLY): I-stat hCG, quantitative: <5 m[IU]/mL (ref <5)

## 2014-02-27 MED ORDER — SODIUM CHLORIDE 0.9 % IV SOLN: 1000.0000 mL | INTRAVENOUS | Status: DC

## 2014-02-27 MED ORDER — ACETAMINOPHEN 325 MG PO TABS
650.0000 mg | ORAL_TABLET | Freq: Once | ORAL | Status: AC
  Administered 2014-02-27: 650 mg via ORAL
  Filled 2014-02-27: qty 2

## 2014-02-27 MED ORDER — SODIUM CHLORIDE 0.9 % IV SOLN
1000.0000 mL | Freq: Once | INTRAVENOUS | Status: AC
  Administered 2014-02-27: 1000 mL via INTRAVENOUS

## 2014-02-27 MED ORDER — NALOXONE HCL 1 MG/ML IJ SOLN
1.0000 mg | Freq: Once | INTRAMUSCULAR | Status: DC
  Filled 2014-02-27: qty 2

## 2014-02-27 NOTE — ED Notes (Signed)
Patient was given her belongings. Patient pills was given to husband.

## 2014-02-27 NOTE — ED Provider Notes (Signed)
CSN: [id number]     Arrival date & time 02/27/14  1910 History   First MD Initiated Contact with Patient 02/27/14 1951     Chief Complaint  Patient presents with  . Drug Overdose   Level V caveat: Altered mental status HPI The patient presents to the emergency room for altered mental status. The patient was at a [employer] grocery store. According to staff at the grocery store the patient appeared drunk. They called 911. The patient told the staff that she took some of her medications but denied any drug use. She denies any alcohol use.  Patient reported this information to EMS personnel. Here in the emergency department she is very somnolent and will not speak to me. Past Medical History  Diagnosis Date  . Asthma   . Seizure   . Protein C deficiency   . Protein S deficiency    Past Surgical History  Procedure Laterality Date  . Mouth surgery    . Dilation and curettage of uterus     History reviewed. No pertinent family history. History  Substance Use Topics  . Smoking status: Current Every Day Smoker -- 0.50 packs/day    Types: Cigarettes  . Smokeless tobacco: Never Used  . Alcohol Use: No   OB History    No data available     Review of Systems  All other systems reviewed and are negative.     Allergies  Aleve  Home Medications   Prior to Admission medications   Medication Sig Start Date End Date Taking? Authorizing Provider  albuterol (PROVENTIL HFA;VENTOLIN HFA) 108 (90 BASE) MCG/ACT inhaler Inhale 2 puffs into the lungs every 4 (four) hours as needed. For wheezing    Historical Provider, MD  ALPRAZolam Prudy Feeler(XANAX) 1 MG tablet Take 1 mg by mouth See admin instructions. Take 2 mg twice daily  As needed for anxiety or sleep    Historical Provider, MD  Aspirin-Acetaminophen-Caffeine (GOODY HEADACHE PO) Take 1 packet by mouth daily as needed (headache).    Historical Provider, MD  carisoprodol (SOMA) 350 MG tablet Take 350 mg by mouth 4 (four) times daily as needed.  For pain    Historical Provider, MD  promethazine (PHENERGAN) 25 MG tablet Take 25 mg by mouth every 6 (six) hours as needed. For nausea    Historical Provider, MD  rizatriptan (MAXALT) 10 MG tablet Take 10 mg by mouth as needed. May repeat in 2 hours if needed for migraines    Historical Provider, MD  traMADol (ULTRAM) 50 MG tablet Take 50 mg by mouth every 6 (six) hours as needed for pain.    Historical Provider, MD   BP 122/74 mmHg  Pulse 89  Temp(Src) 98 F (36.7 C) (Oral)  Resp 17  SpO2 96% Physical Exam  Constitutional: She appears lethargic. No distress.  HENT:  Head: Normocephalic and atraumatic.  Right Ear: External ear normal.  Left Ear: External ear normal.  Eyes: Conjunctivae are normal. Right eye exhibits no discharge. Left eye exhibits no discharge. No scleral icterus.  Neck: Neck supple. No tracheal deviation present.  Cardiovascular: Normal rate, regular rhythm and intact distal pulses.   Pulmonary/Chest: Effort normal and breath sounds normal. No stridor. No respiratory distress. She has no wheezes. She has no rales.  Abdominal: Soft. Bowel sounds are normal. She exhibits no distension. There is no tenderness. There is no rebound and no guarding.  Musculoskeletal: She exhibits no edema or tenderness.  Neurological: She has normal strength. She appears lethargic.  No cranial nerve deficit (no facial droop, extraocular movements intact, no slurred speech) or sensory deficit. She exhibits normal muscle tone. She displays no seizure activity. Coordination normal.  Skin: Skin is warm and dry. No rash noted. She is not diaphoretic.  Psychiatric: She has a normal mood and affect.  Nursing note and vitals reviewed.   ED Course  Procedures (including critical care time) Labs Review Labs Reviewed  CBC WITH DIFFERENTIAL/PLATELET - Abnormal; Notable for the following:    RBC 3.61 (*)    Hemoglobin 7.8 (*)    HCT 26.3 (*)    MCV 72.9 (*)    MCH 21.6 (*)    MCHC 29.7 (*)     RDW 21.9 (*)    All other components within normal limits  COMPREHENSIVE METABOLIC PANEL - Abnormal; Notable for the following:    Potassium 3.4 (*)    Calcium 7.9 (*)    Alkaline Phosphatase 33 (*)    Total Bilirubin 0.1 (*)    All other components within normal limits  SALICYLATE LEVEL - Abnormal; Notable for the following:    Salicylate Lvl 25.9 (*)    All other components within normal limits  APTT  PROTIME-INR  ETHANOL  URINALYSIS, ROUTINE W REFLEX MICROSCOPIC  URINE RAPID DRUG SCREEN (HOSP PERFORMED)  I-STAT BETA HCG BLOOD, ED ([facility], [facility], [facility] ONLY)  TYPE AND SCREEN  ABO/RH    Imaging Review Ct Head Wo Contrast  02/27/2014   CLINICAL DATA:  Drug overdose.  Lethargic.  EXAM: CT HEAD WITHOUT CONTRAST  TECHNIQUE: Contiguous axial images were obtained from the base of the skull through the vertex without intravenous contrast.  COMPARISON:  07/26/2012  FINDINGS: There is no evidence of mass effect, midline shift or extra-axial fluid collections. There is no evidence of a space-occupying lesion or intracranial hemorrhage. There is no evidence of a cortical-based area of acute infarction.  The ventricles and sulci are appropriate for the patient's age. The basal cisterns are patent.  Visualized portions of the orbits are unremarkable. The visualized portions of the paranasal sinuses and mastoid air cells are unremarkable.  The osseous structures are unremarkable.  IMPRESSION: Normal CT of the brain without intravenous contrast.   Electronically Signed   By: Elige Ko   On: 02/27/2014 21:03     EKG Interpretation None      MDM   Final diagnoses:  Drug overdose, accidental or unintentional, initial encounter    2304  the patient is now awake and alert.  She admits that she took [place] today. The patient denies any suicidal intent. She denies any complaints other than stating that she has a migraine headache. She would like some pain medications.  The patient asked for at least some  Ultram. I explained to her that I was concerned about the degree of somnolence she had earlier. I did not think it would be good idea to give her any additional medication. The patient is alert and stable for discharge. She has been eating and drinking without difficulty    Linwood Dibbles, MD 02/27/14 2313

## 2014-02-27 NOTE — ED Notes (Signed)
Per EMS, pt picked up at [employer].  Store called.  Pt appeared drunk to them.  Pt was lethargic, slurred.  Pt states she took 2 of a medication she has in a bottle.  Identifier is (Carisoprodol?)  Pt states they are prescribed to her.    Pt denies ETOH with meds.  Sinus Tach on monitor.  108.  CBG 88, 100% ra.  110/71.

## 2014-02-27 NOTE — ED Notes (Signed)
[facility] Expected date:  Expected time:  Means of arrival:  Comments: EMS OVERDOSE

## 2014-02-28 LAB — ABO/RH: ABO/RH(D): A NEG

## 2016-03-13 ENCOUNTER — Other Ambulatory Visit (HOSPITAL_BASED_OUTPATIENT_CLINIC_OR_DEPARTMENT_OTHER): Payer: Self-pay | Admitting: Emergency Medicine

## 2016-03-13 DIAGNOSIS — R1011 Right upper quadrant pain: Secondary | ICD-10-CM

## 2016-03-14 ENCOUNTER — Ambulatory Visit (HOSPITAL_BASED_OUTPATIENT_CLINIC_OR_DEPARTMENT_OTHER): Payer: 59

## 2016-03-18 ENCOUNTER — Ambulatory Visit (HOSPITAL_BASED_OUTPATIENT_CLINIC_OR_DEPARTMENT_OTHER)
Admission: RE | Admit: 2016-03-18 | Discharge: 2016-03-18 | Disposition: A | Discharge status: Home / Self Care | Payer: 59 | Source: Ambulatory Visit | Attending: Emergency Medicine | Admitting: Emergency Medicine

## 2016-03-18 DIAGNOSIS — R1011 Right upper quadrant pain: Secondary | ICD-10-CM | POA: Insufficient documentation

## 2017-01-30 NOTE — L&D Delivery Note (Signed)
Delivery Note At 1:13 PM a viable and healthy female was delivered via Vaginal, Spontaneous (Presentation: LOA ).  APGAR: 9, 9; weight  pending.   Placenta status: spontaneous, intact.  Cord: none with the following complications: .  Cord pH: na  Anesthesia:  epidural Episiotomy: None Lacerations:  second Suture Repair: 2.0 vicryl rapide and 0 vicryl Est. Blood Loss (mL):  1000cc  Mom to postpartum.  Baby to Nursery.  Jolee Critcher J 11/27/2017, 1:40 PM

## 2017-04-19 LAB — OB RESULTS CONSOLE HIV ANTIBODY (ROUTINE TESTING): HIV: NONREACTIVE

## 2017-04-19 LAB — OB RESULTS CONSOLE HEPATITIS B SURFACE ANTIGEN: Hepatitis B Surface Ag: NEGATIVE

## 2017-04-19 LAB — OB RESULTS CONSOLE GC/CHLAMYDIA
CHLAMYDIA, DNA PROBE: NEGATIVE
GC PROBE AMP, GENITAL: NEGATIVE

## 2017-04-19 LAB — OB RESULTS CONSOLE ABO/RH: RH Type: NEGATIVE

## 2017-04-19 LAB — OB RESULTS CONSOLE RPR: RPR: NONREACTIVE

## 2017-04-19 LAB — OB RESULTS CONSOLE RUBELLA ANTIBODY, IGM: RUBELLA: IMMUNE

## 2017-04-19 LAB — OB RESULTS CONSOLE ANTIBODY SCREEN: Antibody Screen: NEGATIVE

## 2017-04-24 ENCOUNTER — Ambulatory Visit: Payer: BLUE CROSS/BLUE SHIELD | Admitting: Oncology

## 2017-04-24 ENCOUNTER — Encounter: Payer: Self-pay | Admitting: Oncology

## 2017-04-24 ENCOUNTER — Other Ambulatory Visit: Payer: Self-pay

## 2017-04-24 VITALS — BP 101/84 | HR 78 | Temp 97.9°F | Ht 67.0 in | Wt 156.0 lb

## 2017-04-24 DIAGNOSIS — J45909 Unspecified asthma, uncomplicated: Secondary | ICD-10-CM | POA: Diagnosis not present

## 2017-04-24 DIAGNOSIS — G40909 Epilepsy, unspecified, not intractable, without status epilepticus: Secondary | ICD-10-CM

## 2017-04-24 DIAGNOSIS — D509 Iron deficiency anemia, unspecified: Secondary | ICD-10-CM

## 2017-04-24 DIAGNOSIS — O0990 Supervision of high risk pregnancy, unspecified, unspecified trimester: Secondary | ICD-10-CM

## 2017-04-24 DIAGNOSIS — Z3490 Encounter for supervision of normal pregnancy, unspecified, unspecified trimester: Secondary | ICD-10-CM | POA: Insufficient documentation

## 2017-04-24 DIAGNOSIS — D6859 Other primary thrombophilia: Secondary | ICD-10-CM

## 2017-04-24 HISTORY — DX: Iron deficiency anemia, unspecified: D50.9

## 2017-04-24 MED ORDER — PRENATAL VITAMIN 27-0.8 MG PO TABS: 1.0000 | ORAL_TABLET | Freq: Every day | ORAL | Status: DC

## 2017-04-24 MED ORDER — ENOXAPARIN SODIUM 30 MG/0.3ML ~~LOC~~ SOLN: 40.0000 mg | SUBCUTANEOUS | 0 refills | Status: DC

## 2017-04-24 MED ORDER — PROMETHAZINE HCL 12.5 MG PO TABS: 25.0000 mg | ORAL_TABLET | Freq: Four times a day (QID) | ORAL | 0 refills | Status: DC | PRN

## 2017-04-24 NOTE — Progress Notes (Signed)
Hematology and Oncology Follow Up Visit  Kerry Duncan [id number] 1980-03-13 37 y.o. 04/24/2017 10:41 AM   Principle Diagnosis: Encounter Diagnoses  Name Primary?  . Protein S deficiency (HCC)   . High risk pregnancy, antepartum   . Iron deficiency anemia, unspecified iron deficiency anemia type Yes     Interim History: Now 37 year old woman I saw back in 2008.  She was first pregnant at age 37.  She carried until 26 weeks and 6 days and then had a stillbirth.  ANA negative.  Lupus anticoagulant not detected.  Anticardiolipin antibodies negative.  She became pregnant again at age 37 in 2008.  She states her hCG never doubled and she had a first trimester loss labeled missed abortion versus ectopic pregnancy and underwent a D&C procedure in April 2008.  I evaluated her for possible thrombophilia.  She had normal protein C total and active.  She had borderline decrease in protein S activity 68% of control value in March 2008 with a repeat value 89% with reference range 91-147.  Free protein S levels were low at 21%, repeat 18% reference range 50-147. She and her husband tried unsuccessfully for the next few years to get pregnant and underwent a infertility evaluation.  They were just considering adoption when she got pregnant on her own and is now at 8 weeks with a due date December 03, 2017.  Her OB/GYN started her on prophylactic dose Lovenox 40 mg daily.  She was also noted to be anemic with hemoglobin 7.8, MCV 73 on January 29.  She is currently on a prenatal vitamin started 3 weeks ago.  Only other interim medical problem was development of a seizure disorder in 2011.  She was on Keppra but had to stop it since it was causing suicidal ideation.  She recently saw a new neurologist who is Lamictal recommending but she is hesitant to start until the second trimester.  She has about 1 seizure a year.  Last year she had 2.  She also has a history of migraine headaches and asthma.  She uses as  needed Maxalt for the headaches and she uses a as needed inhaler for the asthma.  In reviewing her family history again: Father died at age 56 of complications of heart failure.  Father's sister was on blood thinners during pregnancy for unknown reasons.  Mother started having strokes at age 51.  Maternal grandfather died in his 61s also had a history of strokes.  She has a brother aged 102, sister age 88 who has 4 children, sister age 66 who has 2 children and no one else in the family has had problems with blood clots.  Medications: reviewed  Allergies: Severe GI intolerance to some nonsteroidals but not ibuprofen and she is able to tolerate aspirin preparations including Goody powders. Allergies  Allergen Reactions  . Topamax [Topiramate]   . Aleve [Naproxen Sodium] Nausea And Vomiting    Burns stomach  . Eggs Or Egg-Derived Products   . Mobic [Meloxicam] Other (See Comments)    Ulcers.    Review of Systems: See HPI Remaining ROS negative:   Physical Exam: Blood pressure 101/84, pulse 78, temperature 97.9 F (36.6 C), temperature source Oral, height 5\' 7"  (1.702 m), weight 156 lb (70.8 kg), SpO2 100 %. Wt Readings from Last 3 Encounters:  04/24/17 156 lb (70.8 kg)  02/25/12 140 lb 6.4 oz (63.7 kg)     General appearance: Well-nourished Caucasian woman HENNT: Pharynx no erythema, exudate, mass, or ulcer.  No thyromegaly or thyroid nodules Lymph nodes: No cervical, supraclavicular, or axillary lymphadenopathy Breasts:  Lungs: Clear to auscultation, resonant to percussion throughout Heart: Regular rhythm, no murmur, no gallop, no rub, no click, no edema Abdomen: Soft, nontender, normal bowel sounds, no mass, no organomegaly Extremities: No edema, no calf tenderness Musculoskeletal: no joint deformities GU:  Vascular: Carotid pulses 2+, no bruits,  Neurologic: Alert, oriented, PERRLA, optic discs sharp and vessels normal, no hemorrhage or exudate, cranial nerves grossly normal,  motor strength 5 over 5, reflexes 1+ symmetric, upper body coordination normal, gait normal, Skin: No rash or ecchymosis  Lab Results: CBC W/Diff    Component Value Date/Time   WBC 8.1 02/27/2014 2059   RBC 3.61 (L) 02/27/2014 2059   HGB 7.8 (L) 02/27/2014 2059   HGB 12.2 04/25/2006 0955   HCT 26.3 (L) 02/27/2014 2059   HCT 34.3 (L) 04/25/2006 0955   PLT 339 02/27/2014 2059   PLT 215 04/25/2006 0955   MCV 72.9 (L) 02/27/2014 2059   MCV 85.3 04/25/2006 0955   MCH 21.6 (L) 02/27/2014 2059   MCHC 29.7 (L) 02/27/2014 2059   RDW 21.9 (H) 02/27/2014 2059   RDW 12.6 04/25/2006 0955   LYMPHSABS 1.3 02/27/2014 2059   LYMPHSABS 0.9 04/25/2006 0955   MONOABS 0.6 02/27/2014 2059   MONOABS 0.3 04/25/2006 0955   EOSABS 0.1 02/27/2014 2059   EOSABS 0.3 04/25/2006 0955   BASOSABS 0.0 02/27/2014 2059   BASOSABS 0.0 04/25/2006 0955     Chemistry      Component Value Date/Time   NA 137 02/27/2014 2059   K 3.4 (L) 02/27/2014 2059   CL 111 02/27/2014 2059   CO2 20 02/27/2014 2059   BUN 14 02/27/2014 2059   CREATININE 0.74 02/27/2014 2059      Component Value Date/Time   CALCIUM 7.9 (L) 02/27/2014 2059   ALKPHOS 33 (L) 02/27/2014 2059   AST 20 02/27/2014 2059   ALT 14 02/27/2014 2059   BILITOT 0.1 (L) 02/27/2014 2059       Radiological Studies: No results found.  Impression:  1.  Probable protein S deficiency I am not surewhy there was a discrepancy between the free protein S and the protein S activity in the past.  There is no family history of protein S deficiency and unless she is the index case, protein S deficiency is most commonly seen in first-degree relatives of the index case.  We cannot repeat studies now because protein S normally goes down with pregnancy.  Results would be unreliable. Given her history, to give her the benefit of the doubt, I agree with prophylactic dose low molecular weight heparin for the entire pregnancy and for 6 weeks postpartum.  2.  Iron  deficiency anemia Increasing evidence that maternal iron deficiency can affect fetal growth and early childhood development.  She was just started on oral iron supplement.  She is tolerating this.  I will repeat blood count and ferritin level today as a baseline and again in 1 month.  If hemoglobin is still less than 10.5 g in the second trimester, I will prescribe parenteral Feraheme iron 510 mg IV weekly x2.  This has been shown to be safe and effective in pregnancy with a low rate of infusion reactions, no anaphylactic reactions seen with contemporary preparations, and a 1 and 250,000 incidence of moderate to severe infusion reactions.  3.  Idiopathic seizure disorder currently not on an anticonvulsant  4.  Asthma with infrequent exacerbations controlled with  as needed bronchodilator.  CC: Patient Care Team: Nile Dear, NP as PCP - General (Nurse Practitioner) Olivia Mackie MD, Ob-Gyn  Cephas Darby, MD, FACP  Hematology-Oncology/Internal Medicine     3/26/201910:41 AM

## 2017-04-24 NOTE — Patient Instructions (Signed)
To lab today Return for lab again in 1 month  MD visit 1-2 weeks after lab

## 2017-04-25 LAB — CBC WITH DIFFERENTIAL/PLATELET
BASOS: 0 %
Basophils Absolute: 0 10*3/uL (ref 0.0–0.2)
EOS (ABSOLUTE): 0.4 10*3/uL (ref 0.0–0.4)
Eos: 6 %
HEMATOCRIT: 35.1 % (ref 34.0–46.6)
HEMOGLOBIN: 11.5 g/dL (ref 11.1–15.9)
Immature Grans (Abs): 0 10*3/uL (ref 0.0–0.1)
Immature Granulocytes: 0 %
LYMPHS ABS: 1.2 10*3/uL (ref 0.7–3.1)
Lymphs: 17 %
MCH: 26.8 pg (ref 26.6–33.0)
MCHC: 32.8 g/dL (ref 31.5–35.7)
MCV: 82 fL (ref 79–97)
Monocytes Absolute: 0.6 10*3/uL (ref 0.1–0.9)
Monocytes: 8 %
Neutrophils Absolute: 5.1 10*3/uL (ref 1.4–7.0)
Neutrophils: 69 %
Platelets: 269 10*3/uL (ref 150–379)
RBC: 4.29 x10E6/uL (ref 3.77–5.28)
RDW: 21.5 % — ABNORMAL HIGH (ref 12.3–15.4)
WBC: 7.4 10*3/uL (ref 3.4–10.8)

## 2017-04-25 LAB — IRON AND TIBC
IRON: 57 ug/dL (ref 27–159)
Iron Saturation: 19 % (ref 15–55)
TIBC: 298 ug/dL (ref 250–450)
UIBC: 241 ug/dL (ref 131–425)

## 2017-04-25 LAB — COMPREHENSIVE METABOLIC PANEL
ALBUMIN: 4.6 g/dL (ref 3.5–5.5)
ALT: 38 IU/L — ABNORMAL HIGH (ref 0–32)
AST: 23 IU/L (ref 0–40)
Albumin/Globulin Ratio: 2.2 (ref 1.2–2.2)
Alkaline Phosphatase: 56 IU/L (ref 39–117)
BUN / CREAT RATIO: 11 (ref 9–23)
BUN: 6 mg/dL (ref 6–20)
Bilirubin Total: <0.2 mg/dL (ref 0.0–1.2)
CO2: 22 mmol/L (ref 20–29)
CREATININE: 0.57 mg/dL (ref 0.57–1.00)
Calcium: 9.2 mg/dL (ref 8.7–10.2)
Chloride: 104 mmol/L (ref 96–106)
GFR calc Af Amer: 140 mL/min/{1.73_m2} (ref >59)
GFR calc non Af Amer: 121 mL/min/{1.73_m2} (ref >59)
GLUCOSE: 82 mg/dL (ref 65–99)
Globulin, Total: 2.1 g/dL (ref 1.5–4.5)
Potassium: 4.4 mmol/L (ref 3.5–5.2)
Sodium: 140 mmol/L (ref 134–144)
Total Protein: 6.7 g/dL (ref 6.0–8.5)

## 2017-04-25 LAB — RETICULOCYTES: RETIC CT PCT: 1.5 % (ref 0.6–2.6)

## 2017-04-25 LAB — FERRITIN: Ferritin: 21 ng/mL (ref 15–150)

## 2017-04-25 LAB — LACTATE DEHYDROGENASE: LDH: 165 IU/L (ref 119–226)

## 2017-04-26 ENCOUNTER — Telehealth: Payer: Self-pay | Admitting: *Deleted

## 2017-04-26 NOTE — Telephone Encounter (Signed)
Called and informed pt of Dr Timoteo ExposeG's response about protein s test. Stated ok; we talked about so much- did not remember.

## 2017-04-26 NOTE — Telephone Encounter (Signed)
-----   Message from Levert FeinsteinJames M Granfortuna, MD sent at 04/25/2017  1:57 PM EDT ----- Call pt: blood count already much better on the iron: up from 7.8 to 11.5. Ferritin, iron storage protein, low normal at 21. Stay on iron pills.

## 2017-04-26 NOTE — Telephone Encounter (Signed)
We did not repeat protein S: I told her - we can't do this while she is pregnant since result will be falsely low.

## 2017-04-26 NOTE — Telephone Encounter (Signed)
Requesting to speak with Glenda.

## 2017-04-26 NOTE — Telephone Encounter (Signed)
Pt informed "blood count already much better on the iron: up from 7.8 to 11.5. Ferritin, iron storage protein, low normal at 21. Stay on iron pills." per Dr Cyndie ChimeGranfortuna. Voiced understanding.  She asked about Protein S result?

## 2017-04-26 NOTE — Telephone Encounter (Signed)
Called pt - no answer; left message to give me a call back. 

## 2017-05-25 ENCOUNTER — Other Ambulatory Visit (INDEPENDENT_AMBULATORY_CARE_PROVIDER_SITE_OTHER): Payer: BLUE CROSS/BLUE SHIELD

## 2017-05-25 DIAGNOSIS — D509 Iron deficiency anemia, unspecified: Secondary | ICD-10-CM

## 2017-05-25 DIAGNOSIS — O0990 Supervision of high risk pregnancy, unspecified, unspecified trimester: Secondary | ICD-10-CM

## 2017-05-25 LAB — CBC WITH DIFFERENTIAL/PLATELET
Basophils Absolute: 0 10*3/uL (ref 0.0–0.1)
Basophils Relative: 0 %
Eosinophils Absolute: 0.5 10*3/uL (ref 0.0–0.7)
Eosinophils Relative: 7 %
HCT: 32.5 % — ABNORMAL LOW (ref 36.0–46.0)
Hemoglobin: 10.7 g/dL — ABNORMAL LOW (ref 12.0–15.0)
Lymphocytes Relative: 14 %
Lymphs Abs: 0.9 10*3/uL (ref 0.7–4.0)
MCH: 27.9 pg (ref 26.0–34.0)
MCHC: 32.9 g/dL (ref 30.0–36.0)
MCV: 84.6 fL (ref 78.0–100.0)
Monocytes Absolute: 0.5 10*3/uL (ref 0.1–1.0)
Monocytes Relative: 8 %
NEUTROS PCT: 71 %
Neutro Abs: 5 10*3/uL (ref 1.7–7.7)
Platelets: 222 10*3/uL (ref 150–400)
RBC: 3.84 MIL/uL — ABNORMAL LOW (ref 3.87–5.11)
RDW: 18 % — ABNORMAL HIGH (ref 11.5–15.5)
WBC: 7 10*3/uL (ref 4.0–10.5)

## 2017-05-25 LAB — FERRITIN: FERRITIN: 9 ng/mL — AB (ref 11–307)

## 2017-05-25 NOTE — Addendum Note (Signed)
Addended by: Bufford SpikesFULCHER, Usher Hedberg N on: 05/25/2017 09:21 AM   Modules accepted: Orders

## 2017-05-28 ENCOUNTER — Other Ambulatory Visit: Payer: Self-pay | Admitting: Oncology

## 2017-05-28 DIAGNOSIS — O99019 Anemia complicating pregnancy, unspecified trimester: Principal | ICD-10-CM

## 2017-05-28 DIAGNOSIS — D509 Iron deficiency anemia, unspecified: Secondary | ICD-10-CM

## 2017-06-01 ENCOUNTER — Telehealth: Payer: Self-pay | Admitting: Oncology

## 2017-06-01 ENCOUNTER — Telehealth: Payer: Self-pay | Admitting: Nurse Practitioner

## 2017-06-01 NOTE — Telephone Encounter (Signed)
Patient has appt on Monday with Dr Reece Agar, patient said that she talk to him the other day and she is having some procedures done at [facility] she was wondering do you need to keep that appt on Monday, pls call patient

## 2017-06-01 NOTE — Telephone Encounter (Signed)
rtc to pt and agreed she should keep appt

## 2017-06-04 ENCOUNTER — Ambulatory Visit: Payer: BLUE CROSS/BLUE SHIELD | Admitting: Oncology

## 2017-06-04 ENCOUNTER — Encounter: Payer: Self-pay | Admitting: Oncology

## 2017-06-04 VITALS — BP 119/58 | HR 88 | Temp 99.0°F | Ht 67.0 in | Wt 165.3 lb

## 2017-06-04 DIAGNOSIS — Z3A14 14 weeks gestation of pregnancy: Secondary | ICD-10-CM

## 2017-06-04 DIAGNOSIS — J45909 Unspecified asthma, uncomplicated: Secondary | ICD-10-CM

## 2017-06-04 DIAGNOSIS — Z888 Allergy status to other drugs, medicaments and biological substances status: Secondary | ICD-10-CM

## 2017-06-04 DIAGNOSIS — Z8669 Personal history of other diseases of the nervous system and sense organs: Secondary | ICD-10-CM | POA: Diagnosis not present

## 2017-06-04 DIAGNOSIS — D6859 Other primary thrombophilia: Secondary | ICD-10-CM

## 2017-06-04 DIAGNOSIS — D509 Iron deficiency anemia, unspecified: Secondary | ICD-10-CM

## 2017-06-04 DIAGNOSIS — O2621 Pregnancy care for patient with recurrent pregnancy loss, first trimester: Secondary | ICD-10-CM

## 2017-06-04 DIAGNOSIS — G40909 Epilepsy, unspecified, not intractable, without status epilepticus: Secondary | ICD-10-CM

## 2017-06-04 DIAGNOSIS — O09891 Supervision of other high risk pregnancies, first trimester: Secondary | ICD-10-CM

## 2017-06-04 DIAGNOSIS — K219 Gastro-esophageal reflux disease without esophagitis: Secondary | ICD-10-CM | POA: Diagnosis not present

## 2017-06-04 DIAGNOSIS — Z91012 Allergy to eggs: Secondary | ICD-10-CM

## 2017-06-04 DIAGNOSIS — Z7901 Long term (current) use of anticoagulants: Secondary | ICD-10-CM

## 2017-06-04 DIAGNOSIS — Z886 Allergy status to analgesic agent status: Secondary | ICD-10-CM | POA: Diagnosis not present

## 2017-06-04 DIAGNOSIS — O0990 Supervision of high risk pregnancy, unspecified, unspecified trimester: Secondary | ICD-10-CM

## 2017-06-04 NOTE — Patient Instructions (Signed)
IV iron on 5/7 & 5/14 at [facility] short stay unit on 2nd floor Lab at internal med center on 7/15 and again on 9/30 MD visit with Dr Reece Agar 1-2 weeks after lab

## 2017-06-04 NOTE — Progress Notes (Signed)
Hematology and Oncology Follow Up Visit  Kerry Duncan [id number] 1980/10/06 37 y.o. 06/04/2017 2:48 PM   Principle Diagnosis: Encounter Diagnoses  Name Primary?  . Protein S deficiency (HCC) Yes  . High risk pregnancy, antepartum   . Iron deficiency anemia, unspecified iron deficiency anemia type   Clinical summary: 37 year old woman with history of protein S deficiency and recurrent miscarriage.  Antiphospholipid antibody and lupus anticoagulant negative.  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  Recently reevaluated on April 24, 2017 with no interim visits since 2008.  She is currently pregnant.  To date December 01, 2017.  She began prophylactic dose low molecular weight heparin at the onset of the pregnancy.  She was mildly anemic with hemoglobin 11.5 and ferritin 21.  She was started on an oral iron supplement in addition to her prenatal vitamins.  Interim History: Pregnancy is progressing well.  She is now at 14 weeks.  She will likely have an elective induction of labor on October 28. Despite oral iron supplementation, hemoglobin down t 10.7 and ferritin 9 on April 26. She is having reflux from the pregnancy.  This is causing intermittent cough.  This in turn has set off a flareup of her asthma.  She recently was treated with Symbicort inhaler and a 5-day course of oral prednisone and symptoms have significantly subsided. No problems with the Lovenox injections.  No clinical bleeding.  No headache or change in vision. She denies any dyspnea, chest pain, or palpitations.  Medications:  Current Outpatient Medications:  .  albuterol (PROVENTIL HFA;VENTOLIN HFA) 108 (90 BASE) MCG/ACT inhaler, Inhale 2 puffs into the lungs every 4 (four) hours as needed. For wheezing, Disp: , Rfl:  .  ALPRAZolam (XANAX) 1 MG tablet, Take 1 mg by mouth See admin instructions. Take 2 mg twice daily  As needed for anxiety or sleep, Disp: , Rfl:  .   Aspirin-Acetaminophen-Caffeine (GOODY HEADACHE PO), Take 1 packet by mouth daily as needed (headache)., Disp: , Rfl:  .  enoxaparin (LOVENOX) 30 MG/0.3ML injection, Inject 0.4 mLs (40 mg total) into the skin daily., Disp: , Rfl: 0 .  Prenatal Vit-Fe Fumarate-FA (PRENATAL VITAMIN) 27-0.8 MG TABS, Take 1 capsule by mouth daily., Disp: 30 tablet, Rfl:  .  promethazine (PHENERGAN) 12.5 MG tablet, Take 2 tablets (25 mg total) by mouth every 6 (six) hours as needed for nausea or vomiting., Disp: 30 tablet, Rfl: 0 .  promethazine (PHENERGAN) 25 MG tablet, Take 25 mg by mouth every 6 (six) hours as needed. For nausea, Disp: , Rfl:  .  rizatriptan (MAXALT) 10 MG tablet, Take 10 mg by mouth as needed. May repeat in 2 hours if needed for migraines, Disp: , Rfl:   Allergies:  Allergies  Allergen Reactions  . Topamax [Topiramate]   . Aleve [Naproxen Sodium] Nausea And Vomiting    Burns stomach  . Eggs Or Egg-Derived Products   . Mobic [Meloxicam] Other (See Comments)    Ulcers.    Review of Systems: See interim history Remaining ROS negative:   Physical Exam: Blood pressure (!) 119/58, pulse 88, temperature 99 F (37.2 C), temperature source Oral, height  (1.702 m), weight 165 lb 4.8 oz (75 kg), SpO2 100 %. Wt Readings from Last 3 Encounters:  06/04/17 165 lb 4.8 oz (75 kg)  04/24/17 156 lb (70.8 kg)  02/25/12 140 lb 6.4 oz (63.7 kg)     General appearance: Well-nourished Caucasian woman HENNT: Pharynx no erythema, exudate,  mass, or ulcer. No thyromegaly or thyroid nodules Lymph nodes: No cervical, supraclavicular, or axillary lymphadenopathy Breasts:. Lungs: Clear to auscultation, resonant to percussion throughout Heart: Regular rhythm, no murmur, no gallop, no rub, no click, no edema Abdomen: Soft, nontender, normal bowel sounds, no mass, no organomegaly second trimester pregnancy. Extremities: No edema, no calf tenderness Musculoskeletal: no joint deformities GU:  Vascular:  Carotid pulses 2+, no bruits, distal pulses: Dorsalis pedis 1+ symmetric Neurologic: Alert, oriented, PERRLA, optic discs sharp and vessels normal, no hemorrhage or exudate, cranial nerves grossly normal, motor strength 5 over 5, reflexes 1+ symmetric, upper body coordination normal, gait normal, Skin: No rash or ecchymosis  Lab Results: CBC W/Diff    Component Value Date/Time   WBC 7.0 05/25/2017 0925   RBC 3.84 (L) 05/25/2017 0925   HGB 10.7 (L) 05/25/2017 0925   HGB 11.5 04/24/2017 1020   HGB 12.2 04/25/2006 0955   HCT 32.5 (L) 05/25/2017 0925   HCT 35.1 04/24/2017 1020   HCT 34.3 (L) 04/25/2006 0955   PLT 222 05/25/2017 0925   PLT 269 04/24/2017 1020   MCV 84.6 05/25/2017 0925   MCV 82 04/24/2017 1020   MCV 85.3 04/25/2006 0955   MCH 27.9 05/25/2017 0925   MCHC 32.9 05/25/2017 0925   RDW 18.0 (H) 05/25/2017 0925   RDW 21.5 (H) 04/24/2017 1020   RDW 12.6 04/25/2006 0955   LYMPHSABS 0.9 05/25/2017 0925   LYMPHSABS 1.2 04/24/2017 1020   LYMPHSABS 0.9 04/25/2006 0955   MONOABS 0.5 05/25/2017 0925   MONOABS 0.3 04/25/2006 0955   EOSABS 0.5 05/25/2017 0925   EOSABS 0.4 04/24/2017 1020   BASOSABS 0.0 05/25/2017 0925   BASOSABS 0.0 04/24/2017 1020   BASOSABS 0.0 04/25/2006 0955     Chemistry      Component Value Date/Time   NA 140 04/24/2017 1020   K 4.4 04/24/2017 1020   CL 104 04/24/2017 1020   CO2 22 04/24/2017 1020   BUN 6 04/24/2017 1020   CREATININE 0.57 04/24/2017 1020      Component Value Date/Time   CALCIUM 9.2 04/24/2017 1020   ALKPHOS 56 04/24/2017 1020   AST 23 04/24/2017 1020   ALT 38 (H) 04/24/2017 1020   BILITOT <0.2 04/24/2017 1020       Radiological Studies: No results found.  Impression: 1.  Probable protein S deficiency   2.  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  Seizure disorder intolerant to Keppra with plan to start Lamictal.  4.  Current active pregnancy Continue prophylactic dose Lovenox.  Stop 1 day prior to  planned induction of labor and resume 24 hours postdelivery and continue for 6 weeks.  5.  Iron deficiency anemia of pregnancy. Most experts now recommending parenteral iron in the second or third trimester for patients who do not respond to oral iron in the first trimester. She has had parenteral iron in the remote past without any problems.  We reviewed again potential side effects.  Over 10,000 people have been treated with current parenteral iron preparations with a 0 incidence of anaphylaxis.  Significant side effects seen in about 1 in 250,000 patients.  A number of small studies in pregnant women including 100 women studied here in [place] show that parenteral iron is safe and effective and pregnancy and is currently recommended by [employer]. She will receive a 510 mg dose of Feraheme tomorrow.  If well tolerated then a second dose in 1 week.  6.  Asthma with  recent flare  7.  GERD  8.  History of migraine headache  CC: Patient Care Team: Nile Dear, NP as PCP - General (Nurse Practitioner) Levert Feinstein, MD as Consulting Physician (Oncology) Olivia Mackie, MD as Consulting Physician (Obstetrics and Gynecology)   Cephas Darby, MD, FACP  Hematology-Oncology/Internal Medicine     5/6/20192:48 PM

## 2017-06-05 ENCOUNTER — Ambulatory Visit (HOSPITAL_COMMUNITY)
Admission: RE | Admit: 2017-06-05 | Discharge: 2017-06-05 | Disposition: A | Discharge status: Home / Self Care | Payer: BLUE CROSS/BLUE SHIELD | Source: Ambulatory Visit | Attending: Oncology | Admitting: Oncology

## 2017-06-05 DIAGNOSIS — Z3A14 14 weeks gestation of pregnancy: Secondary | ICD-10-CM | POA: Insufficient documentation

## 2017-06-05 DIAGNOSIS — O99012 Anemia complicating pregnancy, second trimester: Secondary | ICD-10-CM | POA: Insufficient documentation

## 2017-06-05 DIAGNOSIS — D509 Iron deficiency anemia, unspecified: Secondary | ICD-10-CM | POA: Diagnosis present

## 2017-06-05 DIAGNOSIS — O99019 Anemia complicating pregnancy, unspecified trimester: Secondary | ICD-10-CM

## 2017-06-05 MED ORDER — SODIUM CHLORIDE 0.9 % IV SOLN
510.0000 mg | INTRAVENOUS | Status: DC
  Administered 2017-06-05: 510 mg via INTRAVENOUS
  Filled 2017-06-05: qty 17

## 2017-06-05 MED ORDER — SODIUM CHLORIDE 0.9 % IV SOLN
510.0000 mg | INTRAVENOUS | Status: DC
  Filled 2017-06-05: qty 17

## 2017-06-05 NOTE — Discharge Instructions (Signed)

## 2017-06-07 ENCOUNTER — Other Ambulatory Visit (HOSPITAL_COMMUNITY): Payer: Self-pay | Admitting: *Deleted

## 2017-06-12 ENCOUNTER — Ambulatory Visit (HOSPITAL_COMMUNITY)
Admission: RE | Admit: 2017-06-12 | Discharge: 2017-06-12 | Disposition: A | Discharge status: Home / Self Care | Payer: BLUE CROSS/BLUE SHIELD | Source: Ambulatory Visit | Attending: Oncology | Admitting: Oncology

## 2017-06-12 DIAGNOSIS — Z3A15 15 weeks gestation of pregnancy: Secondary | ICD-10-CM | POA: Insufficient documentation

## 2017-06-12 DIAGNOSIS — O99012 Anemia complicating pregnancy, second trimester: Secondary | ICD-10-CM | POA: Diagnosis present

## 2017-06-12 MED ORDER — FERUMOXYTOL INJECTION 510 MG/17 ML
510.0000 mg | Freq: Once | INTRAVENOUS | Status: AC
  Administered 2017-06-12: 510 mg via INTRAVENOUS
  Filled 2017-06-12: qty 17

## 2017-08-13 ENCOUNTER — Other Ambulatory Visit (INDEPENDENT_AMBULATORY_CARE_PROVIDER_SITE_OTHER): Payer: BLUE CROSS/BLUE SHIELD

## 2017-08-13 DIAGNOSIS — D6859 Other primary thrombophilia: Secondary | ICD-10-CM | POA: Diagnosis not present

## 2017-08-13 DIAGNOSIS — D509 Iron deficiency anemia, unspecified: Secondary | ICD-10-CM | POA: Diagnosis not present

## 2017-08-13 DIAGNOSIS — O0990 Supervision of high risk pregnancy, unspecified, unspecified trimester: Secondary | ICD-10-CM

## 2017-08-14 ENCOUNTER — Telehealth: Payer: Self-pay | Admitting: Oncology

## 2017-08-14 ENCOUNTER — Telehealth: Payer: Self-pay | Admitting: *Deleted

## 2017-08-14 LAB — FERRITIN: Ferritin: 48 ng/mL (ref 15–150)

## 2017-08-14 LAB — CBC WITH DIFFERENTIAL/PLATELET
Basophils Absolute: 0 10*3/uL (ref 0.0–0.2)
Basos: 0 %
EOS (ABSOLUTE): 0.3 10*3/uL (ref 0.0–0.4)
Eos: 4 %
Hematocrit: 34.7 % (ref 34.0–46.6)
Hemoglobin: 11.5 g/dL (ref 11.1–15.9)
IMMATURE GRANULOCYTES: 0 %
Immature Grans (Abs): 0 10*3/uL (ref 0.0–0.1)
Lymphocytes Absolute: 0.8 10*3/uL (ref 0.7–3.1)
Lymphs: 9 %
MCH: 30.3 pg (ref 26.6–33.0)
MCHC: 33.1 g/dL (ref 31.5–35.7)
MCV: 92 fL (ref 79–97)
MONOS ABS: 0.6 10*3/uL (ref 0.1–0.9)
Monocytes: 6 %
NEUTROS PCT: 81 %
Neutrophils Absolute: 7 10*3/uL (ref 1.4–7.0)
PLATELETS: 193 10*3/uL (ref 150–450)
RBC: 3.79 x10E6/uL (ref 3.77–5.28)
RDW: 14.2 % (ref 12.3–15.4)
WBC: 8.7 10*3/uL (ref 3.4–10.8)

## 2017-08-14 LAB — IRON AND TIBC
Iron Saturation: 40 % (ref 15–55)
Iron: 118 ug/dL (ref 27–159)
Total Iron Binding Capacity: 294 ug/dL (ref 250–450)
UIBC: 176 ug/dL (ref 131–425)

## 2017-08-14 NOTE — Telephone Encounter (Signed)
Pt informed "Hb up to 11.5; iron stores back to normal (Ferritin 48)" per Dr Cyndie ChimeGranfortuna. Also asked to fax copy to Dr Jorene Minorsaavon's office (OB/GYN),Tracie, lab stated she will fax results.

## 2017-08-14 NOTE — Progress Notes (Signed)
Labs results were faxed to Dr Brien Few at [facility], [phone], 08-14-17 at [place], PBT 08-14-17 11:00

## 2017-08-14 NOTE — Telephone Encounter (Signed)
-----   Message from Levert FeinsteinJames M Granfortuna, MD sent at 08/14/2017  8:57 AM EDT ----- Call pt: Hb up to 11.5; iron stores back to normal

## 2017-08-14 NOTE — Telephone Encounter (Signed)
Called pt - no answer; left message to give me a call back. 

## 2017-09-26 ENCOUNTER — Ambulatory Visit: Payer: BLUE CROSS/BLUE SHIELD

## 2017-10-29 ENCOUNTER — Other Ambulatory Visit (INDEPENDENT_AMBULATORY_CARE_PROVIDER_SITE_OTHER): Payer: BLUE CROSS/BLUE SHIELD

## 2017-10-29 DIAGNOSIS — O0990 Supervision of high risk pregnancy, unspecified, unspecified trimester: Secondary | ICD-10-CM | POA: Diagnosis not present

## 2017-10-29 DIAGNOSIS — D6859 Other primary thrombophilia: Secondary | ICD-10-CM | POA: Diagnosis not present

## 2017-10-29 DIAGNOSIS — D509 Iron deficiency anemia, unspecified: Secondary | ICD-10-CM

## 2017-10-29 LAB — OB RESULTS CONSOLE GBS: GBS: NEGATIVE

## 2017-10-30 LAB — CBC WITH DIFFERENTIAL/PLATELET
BASOS: 0 %
Basophils Absolute: 0 10*3/uL (ref 0.0–0.2)
EOS (ABSOLUTE): 0.2 10*3/uL (ref 0.0–0.4)
EOS: 3 %
Hematocrit: 34.3 % (ref 34.0–46.6)
Hemoglobin: 11.7 g/dL (ref 11.1–15.9)
IMMATURE GRANULOCYTES: 1 %
Immature Grans (Abs): 0.1 10*3/uL (ref 0.0–0.1)
Lymphocytes Absolute: 0.9 10*3/uL (ref 0.7–3.1)
Lymphs: 11 %
MCH: 30.5 pg (ref 26.6–33.0)
MCHC: 34.1 g/dL (ref 31.5–35.7)
MCV: 90 fL (ref 79–97)
Monocytes Absolute: 0.6 10*3/uL (ref 0.1–0.9)
Monocytes: 8 %
Neutrophils Absolute: 6.2 10*3/uL (ref 1.4–7.0)
Neutrophils: 77 %
Platelets: 191 10*3/uL (ref 150–450)
RBC: 3.83 x10E6/uL (ref 3.77–5.28)
RDW: 12.9 % (ref 12.3–15.4)
WBC: 8 10*3/uL (ref 3.4–10.8)

## 2017-10-30 LAB — LACTATE DEHYDROGENASE: LDH: 159 IU/L (ref 119–226)

## 2017-10-30 LAB — COMPREHENSIVE METABOLIC PANEL
ALT: 13 IU/L (ref 0–32)
AST: 12 IU/L (ref 0–40)
Albumin/Globulin Ratio: 1.9 (ref 1.2–2.2)
Albumin: 3.6 g/dL (ref 3.5–5.5)
Alkaline Phosphatase: 95 IU/L (ref 39–117)
BUN/Creatinine Ratio: 18 (ref 9–23)
BUN: 8 mg/dL (ref 6–20)
Bilirubin Total: <0.2 mg/dL (ref 0.0–1.2)
CALCIUM: 8.7 mg/dL (ref 8.7–10.2)
CO2: 20 mmol/L (ref 20–29)
Chloride: 103 mmol/L (ref 96–106)
Creatinine, Ser: 0.44 mg/dL — ABNORMAL LOW (ref 0.57–1.00)
GFR, EST AFRICAN AMERICAN: 151 mL/min/{1.73_m2} (ref >59)
GFR, EST NON AFRICAN AMERICAN: 131 mL/min/{1.73_m2} (ref >59)
Globulin, Total: 1.9 g/dL (ref 1.5–4.5)
Glucose: 128 mg/dL — ABNORMAL HIGH (ref 65–99)
Potassium: 3.7 mmol/L (ref 3.5–5.2)
Sodium: 139 mmol/L (ref 134–144)
TOTAL PROTEIN: 5.5 g/dL — AB (ref 6.0–8.5)

## 2017-10-30 LAB — FERRITIN: Ferritin: 30 ng/mL (ref 15–150)

## 2017-10-31 ENCOUNTER — Telehealth: Payer: Self-pay | Admitting: *Deleted

## 2017-10-31 ENCOUNTER — Other Ambulatory Visit: Payer: Self-pay | Admitting: Oncology

## 2017-10-31 DIAGNOSIS — D509 Iron deficiency anemia, unspecified: Secondary | ICD-10-CM

## 2017-10-31 DIAGNOSIS — O0990 Supervision of high risk pregnancy, unspecified, unspecified trimester: Secondary | ICD-10-CM

## 2017-10-31 NOTE — Telephone Encounter (Signed)
IV iron appt scheduled 10/15 @ 0900 AM at [facility] Short Stay . Called pt -no answer; left message to call me back.

## 2017-10-31 NOTE — Telephone Encounter (Signed)
Noted thanks °

## 2017-10-31 NOTE — Telephone Encounter (Signed)
OK I will put in orders. I will just give 1 dose.

## 2017-10-31 NOTE — Telephone Encounter (Signed)
Pt called / informed "Hb 11.7: best ever for her. Iron stores drifting down (ferritin @ 30) but still OK. We could give one extra dose of IV iron now but it would be OK to wait until after she delivers. " per Dr Cyndie Chime. Stated she would like to have iv iron now; told her I would let u know and get her scheduled at short stay.

## 2017-10-31 NOTE — Telephone Encounter (Signed)
-----   Message from Levert Feinstein, MD sent at 10/30/2017  1:03 PM EDT ----- Call pt: Hb 11.7: best ever for her. Iron stores drifting down but still OK. We could give one extra dose of IV iron now but it would be OK to wait until after she delivers.

## 2017-10-31 NOTE — Telephone Encounter (Signed)
Pt stated she has an U/S scheduled on the 15th; appt changed to the 16th @ 0900 AM.

## 2017-11-13 ENCOUNTER — Other Ambulatory Visit (HOSPITAL_COMMUNITY): Payer: Self-pay | Admitting: *Deleted

## 2017-11-13 ENCOUNTER — Encounter (HOSPITAL_COMMUNITY): Payer: BLUE CROSS/BLUE SHIELD

## 2017-11-14 ENCOUNTER — Telehealth (HOSPITAL_COMMUNITY): Payer: Self-pay | Admitting: *Deleted

## 2017-11-14 ENCOUNTER — Other Ambulatory Visit: Payer: Self-pay | Admitting: Obstetrics and Gynecology

## 2017-11-14 ENCOUNTER — Encounter (HOSPITAL_COMMUNITY): Payer: Self-pay | Admitting: *Deleted

## 2017-11-14 ENCOUNTER — Ambulatory Visit (HOSPITAL_COMMUNITY)
Admission: RE | Admit: 2017-11-14 | Discharge: 2017-11-14 | Disposition: A | Discharge status: Home / Self Care | Payer: BLUE CROSS/BLUE SHIELD | Source: Ambulatory Visit | Attending: Oncology | Admitting: Oncology

## 2017-11-14 DIAGNOSIS — Z3A00 Weeks of gestation of pregnancy not specified: Secondary | ICD-10-CM | POA: Diagnosis not present

## 2017-11-14 DIAGNOSIS — O0990 Supervision of high risk pregnancy, unspecified, unspecified trimester: Secondary | ICD-10-CM | POA: Insufficient documentation

## 2017-11-14 DIAGNOSIS — D509 Iron deficiency anemia, unspecified: Secondary | ICD-10-CM | POA: Diagnosis present

## 2017-11-14 MED ORDER — SODIUM CHLORIDE 0.9 % IV SOLN
510.0000 mg | Freq: Once | INTRAVENOUS | Status: AC
  Administered 2017-11-14: 510 mg via INTRAVENOUS
  Filled 2017-11-14: qty 17

## 2017-11-14 NOTE — Telephone Encounter (Signed)
Preadmission screen  

## 2017-11-19 ENCOUNTER — Other Ambulatory Visit: Payer: Self-pay

## 2017-11-19 ENCOUNTER — Encounter: Payer: Self-pay | Admitting: Oncology

## 2017-11-19 ENCOUNTER — Ambulatory Visit: Payer: BLUE CROSS/BLUE SHIELD | Admitting: Oncology

## 2017-11-19 ENCOUNTER — Telehealth: Payer: Self-pay | Admitting: *Deleted

## 2017-11-19 VITALS — BP 128/70 | HR 96 | Temp 98.3°F | Ht 67.0 in | Wt 211.7 lb

## 2017-11-19 DIAGNOSIS — Z888 Allergy status to other drugs, medicaments and biological substances status: Secondary | ICD-10-CM

## 2017-11-19 DIAGNOSIS — G40802 Other epilepsy, not intractable, without status epilepticus: Secondary | ICD-10-CM

## 2017-11-19 DIAGNOSIS — O99113 Other diseases of the blood and blood-forming organs and certain disorders involving the immune mechanism complicating pregnancy, third trimester: Secondary | ICD-10-CM

## 2017-11-19 DIAGNOSIS — Z886 Allergy status to analgesic agent status: Secondary | ICD-10-CM

## 2017-11-19 DIAGNOSIS — G43909 Migraine, unspecified, not intractable, without status migrainosus: Secondary | ICD-10-CM

## 2017-11-19 DIAGNOSIS — K068 Other specified disorders of gingiva and edentulous alveolar ridge: Secondary | ICD-10-CM

## 2017-11-19 DIAGNOSIS — O09893 Supervision of other high risk pregnancies, third trimester: Secondary | ICD-10-CM | POA: Diagnosis not present

## 2017-11-19 DIAGNOSIS — O99013 Anemia complicating pregnancy, third trimester: Secondary | ICD-10-CM

## 2017-11-19 DIAGNOSIS — O99353 Diseases of the nervous system complicating pregnancy, third trimester: Secondary | ICD-10-CM

## 2017-11-19 DIAGNOSIS — Z3A00 Weeks of gestation of pregnancy not specified: Secondary | ICD-10-CM

## 2017-11-19 DIAGNOSIS — K069 Disorder of gingiva and edentulous alveolar ridge, unspecified: Secondary | ICD-10-CM

## 2017-11-19 DIAGNOSIS — O0990 Supervision of high risk pregnancy, unspecified, unspecified trimester: Secondary | ICD-10-CM

## 2017-11-19 DIAGNOSIS — D6859 Other primary thrombophilia: Secondary | ICD-10-CM

## 2017-11-19 DIAGNOSIS — Z91012 Allergy to eggs: Secondary | ICD-10-CM

## 2017-11-19 DIAGNOSIS — O99019 Anemia complicating pregnancy, unspecified trimester: Secondary | ICD-10-CM | POA: Diagnosis not present

## 2017-11-19 DIAGNOSIS — Z7901 Long term (current) use of anticoagulants: Secondary | ICD-10-CM

## 2017-11-19 DIAGNOSIS — D509 Iron deficiency anemia, unspecified: Secondary | ICD-10-CM

## 2017-11-19 LAB — APTT: APTT: 31 s (ref 24–36)

## 2017-11-19 LAB — CBC WITH DIFFERENTIAL/PLATELET
Abs Immature Granulocytes: 0.04 10*3/uL (ref 0.00–0.07)
BASOS ABS: 0 10*3/uL (ref 0.0–0.1)
Basophils Relative: 0 %
EOS ABS: 0.2 10*3/uL (ref 0.0–0.5)
EOS PCT: 2 %
HEMATOCRIT: 36.7 % (ref 36.0–46.0)
HEMOGLOBIN: 11.7 g/dL — AB (ref 12.0–15.0)
Immature Granulocytes: 1 %
Lymphocytes Relative: 14 %
Lymphs Abs: 1.1 10*3/uL (ref 0.7–4.0)
MCH: 29.4 pg (ref 26.0–34.0)
MCHC: 31.9 g/dL (ref 30.0–36.0)
MCV: 92.2 fL (ref 80.0–100.0)
MONO ABS: 0.6 10*3/uL (ref 0.1–1.0)
Monocytes Relative: 8 %
NRBC: 0 % (ref 0.0–0.2)
Neutro Abs: 6.1 10*3/uL (ref 1.7–7.7)
Neutrophils Relative %: 75 %
Platelets: 217 10*3/uL (ref 150–400)
RBC: 3.98 MIL/uL (ref 3.87–5.11)
RDW: 14.4 % (ref 11.5–15.5)
WBC: 8.1 10*3/uL (ref 4.0–10.5)

## 2017-11-19 NOTE — Progress Notes (Signed)
Hematology and Oncology Follow Up Visit  Kerry Duncan [id number] October 31, 1980 37 y.o. 11/19/2017 10:19 AM   Principle Diagnosis: Encounter Diagnoses  Name Primary?  . High risk pregnancy, antepartum Yes  . Protein S deficiency (HCC)   . Gums, bleeding   . Iron deficiency anemia of mother during pregnancy   Updated clinical summary: 37 year old woman with history of protein S deficiency and recurrent miscarriage.  Antiphospholipid antibody and lupus anticoagulant negative.  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  Recently reevaluated on April 24, 2017 with no interim visits since 2008.  She is currently pregnant.  Due date December 01, 2017.  She began prophylactic dose low molecular weight heparin at the onset of the pregnancy.  She was mildly anemic with hemoglobin 11.5 and ferritin 21.  She was started on an oral iron supplement in addition to her prenatal vitamins.  Hemoglobin and ferritin fell and IV iron was administered.  Good response with rise in hemoglobin to 11.7 and ferritin to 48.  Interim History: She is now late third trimester.  Due date November 2.  Planned induction of labor for October 28.  Overall pregnancy uneventful.  Over the last few weeks she has had intermittent gum bleeding.  Usually just when she brushes her teeth but some episodes have been spontaneous.  Of note she is only taking a prophylactic dose of Lovenox 40 mg daily.  She denies any aspirin or nonsteroidal use.  No gum problems prior to this pregnancy. Flareup of her chronic migraine headaches 2-3 times per week which is typical for her in the spring and fall. We have been tracking hemoglobins and ferritin's.  She received initial 2 doses of parenteral iron.  An additional dose was given last week.  Hemoglobin currently 11.7.  Most recent ferritin drifted down from 48-30 on September 30 and was the reason the additional dose of iron was given.  Earlier in the  pregnancy, ferritin as low as 9.  Medications: reviewed  Allergies:  Allergies  Allergen Reactions  . Topamax [Topiramate]   . Aleve [Naproxen Sodium] Nausea And Vomiting    Burns stomach  . Eggs Or Egg-Derived Products   . Mobic [Meloxicam] Other (See Comments)    Ulcers.    Review of Systems: See interim history Remaining ROS negative:   Physical Exam: Blood pressure 128/70, pulse 96, temperature 98.3 F (36.8 C), temperature source Oral, height 5\' 7"  (1.702 m), weight 211 lb 11.2 oz (96 kg), SpO2 99 %, unknown if currently breastfeeding. Wt Readings from Last 3 Encounters:  11/19/17 211 lb 11.2 oz (96 kg)  11/14/17 210 lb (95.3 kg)  06/12/17 163 lb (73.9 kg)     General appearance: Well-nourished Caucasian woman HENNT: Pharynx no erythema, exudate, mass, or ulcer.  Gums are hypertrophied.  Not currently bleeding.  She appears to have chronic gum disease.  No thyromegaly or thyroid nodules Lymph nodes: No cervical, supraclavicular, or axillary lymphadenopathy Breasts: Lungs: Clear to auscultation, resonant to percussion throughout Heart: Regular rhythm, no murmur, no gallop, no rub, no click, no edema Abdomen: Third trimester pregnancy Extremities: No edema, no calf tenderness Musculoskeletal: no joint deformities GU:  Vascular: Carotid pulses 2+, no bruits, distal pulses: Neurologic: Alert, oriented, PERRLA, cranial nerves grossly normal, motor strength 5 over 5, reflexes 1+ symmetric, upper body coordination normal, gait normal, Skin: No rash or ecchymosis  Lab Results: CBC W/Diff    Component Value Date/Time   WBC 8.1 11/19/2017 0906  RBC 3.98 11/19/2017 0906   HGB 11.7 (L) 11/19/2017 0906   HGB 11.7 10/29/2017 0839   HGB 12.2 04/25/2006 0955   HCT 36.7 11/19/2017 0906   HCT 34.3 10/29/2017 0839   HCT 34.3 (L) 04/25/2006 0955   PLT 217 11/19/2017 0906   PLT 191 10/29/2017 0839   MCV 92.2 11/19/2017 0906   MCV 90 10/29/2017 0839   MCV 85.3 04/25/2006  0955   MCH 29.4 11/19/2017 0906   MCHC 31.9 11/19/2017 0906   RDW 14.4 11/19/2017 0906   RDW 12.9 10/29/2017 0839   RDW 12.6 04/25/2006 0955   LYMPHSABS 1.1 11/19/2017 0906   LYMPHSABS 0.9 10/29/2017 0839   LYMPHSABS 0.9 04/25/2006 0955   MONOABS 0.6 11/19/2017 0906   MONOABS 0.3 04/25/2006 0955   EOSABS 0.2 11/19/2017 0906   EOSABS 0.2 10/29/2017 0839   BASOSABS 0.0 11/19/2017 0906   BASOSABS 0.0 10/29/2017 0839   BASOSABS 0.0 04/25/2006 0955     Chemistry      Component Value Date/Time   NA 139 10/29/2017 0839   K 3.7 10/29/2017 0839   CL 103 10/29/2017 0839   CO2 20 10/29/2017 0839   BUN 8 10/29/2017 0839   CREATININE 0.44 (L) 10/29/2017 0839      Component Value Date/Time   CALCIUM 8.7 10/29/2017 0839   ALKPHOS 95 10/29/2017 0839   AST 12 10/29/2017 0839   ALT 13 10/29/2017 0839   BILITOT <0.2 10/29/2017 0839       Radiological Studies: No results found.  Impression:  Third trimester pregnancy. Coagulopathy due to protein S deficiency with previous recurrent miscarriage. Overall doing well at this time. I do not think her minor gum bleeding is related to the prophylactic dose Lovenox.  She will make an appointment to see a dentist or endodontist. She will continue the Lovenox daily injections.  Stop 1 day prior to planned induction of labor.  Resume night of delivery for a vaginal delivery, or 24 hours after a C-section if otherwise stable.  Continue for 6 weeks postpartum.  CC: Patient Care Team: Nile Dear, NP as PCP - General (Nurse Practitioner) Levert Feinstein, MD as Consulting Physician (Oncology) Olivia Mackie, MD as Consulting Physician (Obstetrics and Gynecology)   Cephas Darby, MD, FACP  Hematology-Oncology/Internal Medicine     10/21/201910:19 AM

## 2017-11-19 NOTE — Patient Instructions (Signed)
To lab today Return visit 6-8 weeks CBC, ferritin day of visit

## 2017-11-19 NOTE — Telephone Encounter (Signed)
Pt called - no answer; left message to call the office. 

## 2017-11-19 NOTE — Telephone Encounter (Signed)
-----   Message from Levert Feinstein, MD sent at 11/19/2017 10:17 AM EDT ----- Call pt: lab all good  Hb 11.7; platelets and clotting screen normal

## 2017-11-20 NOTE — Telephone Encounter (Signed)
Called pt again - no answer; left compliant vm "lab all good Hb 11.7; platelets and clotting screen normal " per Dr Cyndie Chime. ANd call for any questions.

## 2017-11-26 ENCOUNTER — Inpatient Hospital Stay (HOSPITAL_COMMUNITY)
Admission: AD | Admit: 2017-11-26 | Payer: BLUE CROSS/BLUE SHIELD | Source: Ambulatory Visit | Admitting: Obstetrics and Gynecology

## 2017-11-27 ENCOUNTER — Inpatient Hospital Stay (HOSPITAL_COMMUNITY): Payer: BLUE CROSS/BLUE SHIELD | Admitting: Anesthesiology

## 2017-11-27 ENCOUNTER — Inpatient Hospital Stay (HOSPITAL_COMMUNITY)
Admission: RE | Admit: 2017-11-27 | Discharge: 2017-11-29 | DRG: 806 | Disposition: A | Discharge status: Home / Self Care | Payer: BLUE CROSS/BLUE SHIELD | Attending: Obstetrics and Gynecology | Admitting: Obstetrics and Gynecology

## 2017-11-27 ENCOUNTER — Encounter (HOSPITAL_COMMUNITY): Payer: Self-pay

## 2017-11-27 ENCOUNTER — Other Ambulatory Visit: Payer: Self-pay

## 2017-11-27 DIAGNOSIS — O24420 Gestational diabetes mellitus in childbirth, diet controlled: Principal | ICD-10-CM | POA: Diagnosis present

## 2017-11-27 DIAGNOSIS — O9912 Other diseases of the blood and blood-forming organs and certain disorders involving the immune mechanism complicating childbirth: Secondary | ICD-10-CM | POA: Diagnosis present

## 2017-11-27 DIAGNOSIS — Z6791 Unspecified blood type, Rh negative: Secondary | ICD-10-CM

## 2017-11-27 DIAGNOSIS — O26893 Other specified pregnancy related conditions, third trimester: Secondary | ICD-10-CM | POA: Diagnosis present

## 2017-11-27 DIAGNOSIS — O9081 Anemia of the puerperium: Secondary | ICD-10-CM | POA: Diagnosis not present

## 2017-11-27 DIAGNOSIS — Z87891 Personal history of nicotine dependence: Secondary | ICD-10-CM | POA: Diagnosis not present

## 2017-11-27 DIAGNOSIS — O24410 Gestational diabetes mellitus in pregnancy, diet controlled: Secondary | ICD-10-CM

## 2017-11-27 DIAGNOSIS — O9952 Diseases of the respiratory system complicating childbirth: Secondary | ICD-10-CM | POA: Diagnosis present

## 2017-11-27 DIAGNOSIS — Z3A39 39 weeks gestation of pregnancy: Secondary | ICD-10-CM

## 2017-11-27 DIAGNOSIS — J45909 Unspecified asthma, uncomplicated: Secondary | ICD-10-CM | POA: Diagnosis present

## 2017-11-27 DIAGNOSIS — O135 Gestational [pregnancy-induced] hypertension without significant proteinuria, complicating the puerperium: Secondary | ICD-10-CM | POA: Diagnosis present

## 2017-11-27 DIAGNOSIS — D62 Acute posthemorrhagic anemia: Secondary | ICD-10-CM | POA: Diagnosis not present

## 2017-11-27 DIAGNOSIS — Z3490 Encounter for supervision of normal pregnancy, unspecified, unspecified trimester: Secondary | ICD-10-CM | POA: Diagnosis present

## 2017-11-27 DIAGNOSIS — D6859 Other primary thrombophilia: Secondary | ICD-10-CM | POA: Diagnosis present

## 2017-11-27 LAB — COMPREHENSIVE METABOLIC PANEL
ALBUMIN: 3 g/dL — AB (ref 3.5–5.0)
ALK PHOS: 100 U/L (ref 38–126)
ALT: 18 U/L (ref 0–44)
AST: 22 U/L (ref 15–41)
Anion gap: 8 (ref 5–15)
BILIRUBIN TOTAL: 0.6 mg/dL (ref 0.3–1.2)
BUN: 6 mg/dL (ref 6–20)
CALCIUM: 8.4 mg/dL — AB (ref 8.9–10.3)
CO2: 22 mmol/L (ref 22–32)
Chloride: 105 mmol/L (ref 98–111)
Creatinine, Ser: 0.43 mg/dL — ABNORMAL LOW (ref 0.44–1.00)
GFR calc Af Amer: >60 mL/min (ref >60)
GFR calc non Af Amer: >60 mL/min (ref >60)
GLUCOSE: 102 mg/dL — AB (ref 70–99)
POTASSIUM: 3.9 mmol/L (ref 3.5–5.1)
Sodium: 135 mmol/L (ref 135–145)
TOTAL PROTEIN: 5.3 g/dL — AB (ref 6.5–8.1)

## 2017-11-27 LAB — CBC
HCT: 35.1 % — ABNORMAL LOW (ref 36.0–46.0)
HEMATOCRIT: 34.5 % — AB (ref 36.0–46.0)
HEMOGLOBIN: 12.1 g/dL (ref 12.0–15.0)
Hemoglobin: 12 g/dL (ref 12.0–15.0)
MCH: 31.7 pg (ref 26.0–34.0)
MCH: 31.5 pg (ref 26.0–34.0)
MCHC: 34.5 g/dL (ref 30.0–36.0)
MCHC: 34.8 g/dL (ref 30.0–36.0)
MCV: 91.9 fL (ref 80.0–100.0)
MCV: 90.6 fL (ref 80.0–100.0)
NRBC: 0 % (ref 0.0–0.2)
Platelets: 212 10*3/uL (ref 150–400)
Platelets: 172 10*3/uL (ref 150–400)
RBC: 3.82 MIL/uL — AB (ref 3.87–5.11)
RBC: 3.81 MIL/uL — ABNORMAL LOW (ref 3.87–5.11)
RDW: 15.3 % (ref 11.5–15.5)
RDW: 14.8 % (ref 11.5–15.5)
WBC: 9.6 10*3/uL (ref 4.0–10.5)
WBC: 13.6 10*3/uL — AB (ref 4.0–10.5)

## 2017-11-27 LAB — GLUCOSE, CAPILLARY: GLUCOSE-CAPILLARY: 77 mg/dL (ref 70–99)

## 2017-11-27 LAB — RPR: RPR: NONREACTIVE

## 2017-11-27 MED ORDER — DIBUCAINE 1 % RE OINT
1.0000 "application " | TOPICAL_OINTMENT | RECTAL | Status: DC | PRN
  Administered 2017-11-29: 1 via RECTAL
  Filled 2017-11-27 (×2): qty 28

## 2017-11-27 MED ORDER — COCONUT OIL OIL: 1.0000 "application " | TOPICAL_OIL | Status: DC | PRN

## 2017-11-27 MED ORDER — SOD CITRATE-CITRIC ACID 500-334 MG/5ML PO SOLN: 30.0000 mL | ORAL | Status: DC | PRN

## 2017-11-27 MED ORDER — TERBUTALINE SULFATE 1 MG/ML IJ SOLN: 0.2500 mg | Freq: Once | INTRAMUSCULAR | Status: DC | PRN

## 2017-11-27 MED ORDER — TETANUS-DIPHTH-ACELL PERTUSSIS 5-2.5-18.5 LF-MCG/0.5 IM SUSP: 0.5000 mL | Freq: Once | INTRAMUSCULAR | Status: DC

## 2017-11-27 MED ORDER — BENZOCAINE-MENTHOL 20-0.5 % EX AERO
1.0000 "application " | INHALATION_SPRAY | CUTANEOUS | Status: DC | PRN
  Administered 2017-11-27 – 2017-11-29 (×2): 1 via TOPICAL
  Filled 2017-11-27 (×2): qty 56

## 2017-11-27 MED ORDER — SIMETHICONE 80 MG PO CHEW: 80.0000 mg | CHEWABLE_TABLET | ORAL | Status: DC | PRN

## 2017-11-27 MED ORDER — ONDANSETRON HCL 4 MG/2ML IJ SOLN: 4.0000 mg | Freq: Four times a day (QID) | INTRAMUSCULAR | Status: DC | PRN

## 2017-11-27 MED ORDER — ONDANSETRON HCL 4 MG PO TABS: 4.0000 mg | ORAL_TABLET | ORAL | Status: DC | PRN

## 2017-11-27 MED ORDER — OXYTOCIN BOLUS FROM INFUSION
500.0000 mL | Freq: Once | INTRAVENOUS | Status: AC
  Administered 2017-11-27: 500 mL via INTRAVENOUS

## 2017-11-27 MED ORDER — PHENYLEPHRINE 40 MCG/ML (10ML) SYRINGE FOR IV PUSH (FOR BLOOD PRESSURE SUPPORT): 80.0000 ug | PREFILLED_SYRINGE | INTRAVENOUS | Status: DC | PRN

## 2017-11-27 MED ORDER — LIDOCAINE HCL (PF) 1 % IJ SOLN
30.0000 mL | INTRAMUSCULAR | Status: DC | PRN
  Filled 2017-11-27: qty 30

## 2017-11-27 MED ORDER — OXYTOCIN 40 UNITS IN LACTATED RINGERS INFUSION - SIMPLE MED: 1.0000 m[IU]/min | INTRAVENOUS | Status: DC

## 2017-11-27 MED ORDER — LACTATED RINGERS IV SOLN
INTRAVENOUS | Status: DC
  Administered 2017-11-27 (×2): via INTRAVENOUS

## 2017-11-27 MED ORDER — PHENYLEPHRINE 40 MCG/ML (10ML) SYRINGE FOR IV PUSH (FOR BLOOD PRESSURE SUPPORT)
80.0000 ug | PREFILLED_SYRINGE | INTRAVENOUS | Status: DC | PRN
  Filled 2017-11-27 (×2): qty 10

## 2017-11-27 MED ORDER — DIPHENHYDRAMINE HCL 25 MG PO CAPS: 25.0000 mg | ORAL_CAPSULE | Freq: Four times a day (QID) | ORAL | Status: DC | PRN

## 2017-11-27 MED ORDER — BUPIVACAINE HCL (PF) 0.25 % IJ SOLN
INTRAMUSCULAR | Status: DC | PRN
  Administered 2017-11-27: 3 mL via EPIDURAL
  Administered 2017-11-27: 5 mL via EPIDURAL

## 2017-11-27 MED ORDER — LACTATED RINGERS IV SOLN
500.0000 mL | Freq: Once | INTRAVENOUS | Status: AC
  Administered 2017-11-27: 500 mL via INTRAVENOUS

## 2017-11-27 MED ORDER — EPHEDRINE 5 MG/ML INJ: 10.0000 mg | INTRAVENOUS | Status: DC | PRN

## 2017-11-27 MED ORDER — OXYCODONE-ACETAMINOPHEN 5-325 MG PO TABS
1.0000 | ORAL_TABLET | ORAL | Status: DC | PRN
  Administered 2017-11-27: 1 via ORAL
  Filled 2017-11-27: qty 1

## 2017-11-27 MED ORDER — ACETAMINOPHEN 325 MG PO TABS
650.0000 mg | ORAL_TABLET | ORAL | Status: DC | PRN
  Filled 2017-11-27: qty 2

## 2017-11-27 MED ORDER — FENTANYL 2.5 MCG/ML BUPIVACAINE 1/10 % EPIDURAL INFUSION (WH - ANES)
14.0000 mL/h | INTRAMUSCULAR | Status: DC | PRN
  Administered 2017-11-27: 14 mL/h via EPIDURAL
  Filled 2017-11-27: qty 100

## 2017-11-27 MED ORDER — OXYTOCIN 40 UNITS IN LACTATED RINGERS INFUSION - SIMPLE MED
1.0000 m[IU]/min | INTRAVENOUS | Status: DC
  Administered 2017-11-27: 1 m[IU]/min via INTRAVENOUS
  Filled 2017-11-27: qty 1000

## 2017-11-27 MED ORDER — LIDOCAINE HCL (PF) 1 % IJ SOLN
INTRAMUSCULAR | Status: DC | PRN
  Administered 2017-11-27 (×2): 5 mL via EPIDURAL

## 2017-11-27 MED ORDER — METHYLERGONOVINE MALEATE 0.2 MG/ML IJ SOLN: 0.2000 mg | INTRAMUSCULAR | Status: DC | PRN

## 2017-11-27 MED ORDER — ACETAMINOPHEN 325 MG PO TABS
650.0000 mg | ORAL_TABLET | ORAL | Status: DC | PRN
  Administered 2017-11-27: 650 mg via ORAL
  Filled 2017-11-27: qty 2

## 2017-11-27 MED ORDER — WITCH HAZEL-GLYCERIN EX PADS
1.0000 "application " | MEDICATED_PAD | CUTANEOUS | Status: DC | PRN
  Administered 2017-11-27 – 2017-11-29 (×2): 1 via TOPICAL

## 2017-11-27 MED ORDER — SODIUM BICARBONATE 8.4 % IV SOLN
INTRAVENOUS | Status: DC | PRN
  Administered 2017-11-27: 5 mL via EPIDURAL

## 2017-11-27 MED ORDER — OXYTOCIN 40 UNITS IN LACTATED RINGERS INFUSION - SIMPLE MED: 2.5000 [IU]/h | INTRAVENOUS | Status: DC

## 2017-11-27 MED ORDER — DIPHENHYDRAMINE HCL 50 MG/ML IJ SOLN: 12.5000 mg | INTRAMUSCULAR | Status: DC | PRN

## 2017-11-27 MED ORDER — FENTANYL CITRATE (PF) 100 MCG/2ML IJ SOLN
100.0000 ug | INTRAMUSCULAR | Status: DC | PRN
  Administered 2017-11-27 (×3): 100 ug via INTRAVENOUS
  Filled 2017-11-27 (×3): qty 2

## 2017-11-27 MED ORDER — METHYLERGONOVINE MALEATE 0.2 MG PO TABS: 0.2000 mg | ORAL_TABLET | ORAL | Status: DC | PRN

## 2017-11-27 MED ORDER — ENOXAPARIN SODIUM 40 MG/0.4ML ~~LOC~~ SOLN
40.0000 mg | SUBCUTANEOUS | Status: DC
  Administered 2017-11-28: 40 mg via SUBCUTANEOUS
  Filled 2017-11-27: qty 0.4

## 2017-11-27 MED ORDER — LACTATED RINGERS IV SOLN: 500.0000 mL | INTRAVENOUS | Status: DC | PRN

## 2017-11-27 MED ORDER — ONDANSETRON HCL 4 MG/2ML IJ SOLN: 4.0000 mg | INTRAMUSCULAR | Status: DC | PRN

## 2017-11-27 MED ORDER — SENNOSIDES-DOCUSATE SODIUM 8.6-50 MG PO TABS
2.0000 | ORAL_TABLET | ORAL | Status: DC
  Administered 2017-11-27 – 2017-11-28 (×2): 2 via ORAL
  Filled 2017-11-27 (×2): qty 2

## 2017-11-27 MED ORDER — FENTANYL CITRATE (PF) 100 MCG/2ML IJ SOLN
INTRAMUSCULAR | Status: DC | PRN
  Administered 2017-11-27: 100 ug via EPIDURAL

## 2017-11-27 MED ORDER — IBUPROFEN 600 MG PO TABS: 600.0000 mg | ORAL_TABLET | Freq: Four times a day (QID) | ORAL | Status: DC

## 2017-11-27 MED ORDER — MISOPROSTOL 25 MCG QUARTER TABLET
25.0000 ug | ORAL_TABLET | ORAL | Status: DC | PRN
  Administered 2017-11-27: 25 ug via VAGINAL
  Filled 2017-11-27: qty 1

## 2017-11-27 MED ORDER — OXYCODONE-ACETAMINOPHEN 5-325 MG PO TABS
2.0000 | ORAL_TABLET | ORAL | Status: DC | PRN
  Administered 2017-11-27 – 2017-11-29 (×10): 2 via ORAL
  Filled 2017-11-27 (×11): qty 2

## 2017-11-27 MED ORDER — FENTANYL CITRATE (PF) 100 MCG/2ML IJ SOLN
INTRAMUSCULAR | Status: AC
  Administered 2017-11-27: 100 ug via INTRAVENOUS
  Filled 2017-11-27: qty 2

## 2017-11-27 MED ORDER — ZOLPIDEM TARTRATE 5 MG PO TABS: 5.0000 mg | ORAL_TABLET | Freq: Every evening | ORAL | Status: DC | PRN

## 2017-11-27 MED ORDER — PRENATAL MULTIVITAMIN CH
1.0000 | ORAL_TABLET | Freq: Every day | ORAL | Status: DC
  Administered 2017-11-28 – 2017-11-29 (×2): 1 via ORAL
  Filled 2017-11-27 (×2): qty 1

## 2017-11-27 NOTE — Anesthesia Procedure Notes (Signed)
Epidural Patient location during procedure: OB  Staffing Anesthesiologist: Mimi Debellis, MD Performed: anesthesiologist   Preanesthetic Checklist Completed: patient identified, site marked, surgical consent, pre-op evaluation, timeout performed, IV checked, risks and benefits discussed and monitors and equipment checked  Epidural Patient position: sitting Prep: DuraPrep Patient monitoring: heart rate, continuous pulse ox and blood pressure Approach: right paramedian Location: L3-L4 Injection technique: LOR saline  Needle:  Needle type: Tuohy  Needle gauge: 17 G Needle length: 9 cm and 9 Catheter type: closed end flexible Catheter size: 20 Guage Test dose: negative  Assessment Events: blood not aspirated, injection not painful, no injection resistance, negative IV test and no paresthesia  Additional Notes Patient identified. Risks/Benefits/Options discussed with patient including but not limited to bleeding, infection, nerve damage, paralysis, failed block, incomplete pain control, headache, blood pressure changes, nausea, vomiting, reactions to medication both or allergic, itching and postpartum back pain. Confirmed with bedside nurse the patient's most recent platelet count. Confirmed with patient that they are not currently taking any anticoagulation, have any bleeding history or any family history of bleeding disorders. Patient expressed understanding and wished to proceed. All questions were answered. Sterile technique was used throughout the entire procedure. Please see nursing notes for vital signs. Test dose was given through epidural needle and negative prior to continuing to dose epidural or start infusion. Warning signs of high block given to the patient including shortness of breath, tingling/numbness in hands, complete motor block, or any concerning symptoms with instructions to call for help. Patient was given instructions on fall risk and not to get out of bed. All  questions and concerns addressed with instructions to call with any issues.     

## 2017-11-27 NOTE — H&P (Signed)
Kerry Duncan is a 37 y.o. female presenting for IOL. OB History    Gravida  3   Para  1   Term      Preterm  1   AB  1   Living        SAB  1   TAB      Ectopic      Multiple  1   Live Births  0          Past Medical History:  Diagnosis Date  . Anemia, iron deficiency 04/24/2017  . Asthma   . High-risk pregnancy 04/24/2017   Protein S deficiency 2 prior miscarriages: 1st a stillborn age 44, 2nd early miss <6 wks age 8  . Protein C deficiency (HCC)   . Protein S deficiency (HCC)   . PTSD (post-traumatic stress disorder)   . Seizure [facility])    Past Surgical History:  Procedure Laterality Date  . DILATION AND CURETTAGE OF UTERUS    . MOUTH SURGERY     Family History: family history includes Asthma in her father; COPD in her father; Diabetes in her mother; Heart attack in her father; Heart disease in her father; Hypertension in her father and mother; Hypothyroidism in her mother; Irritable bowel syndrome in her mother; Seizures in her sister; Stroke in her maternal grandfather and mother; Varicose Veins in her mother. Social History:  reports that she has quit smoking. Her smoking use included cigarettes. She smoked 0.50 packs per day. She has never used smokeless tobacco. She reports that she does not drink alcohol or use drugs.     Maternal Diabetes: Yes:  Diabetes Type:  Diet controlled Genetic Screening: Normal Maternal Ultrasounds/Referrals: Normal Fetal Ultrasounds or other Referrals:  None Maternal Substance Abuse:  No Significant Maternal Medications:  None Significant Maternal Lab Results:  None Other Comments:  off lovenox  Review of Systems  Constitutional: Negative.   All other systems reviewed and are negative.  Maternal Medical History:  Contractions: Onset was less than 1 hour ago.   Frequency: rare.   Perceived severity is mild.    Fetal activity: Perceived fetal activity is normal.   Last perceived fetal movement was within the past  hour.    Prenatal complications: Thrombophilia.   Prenatal Complications - Diabetes: gestational.    Dilation: 5 Effacement (%): 50 Station: -3 Exam by:: Chukwuemeka, S. RNC Blood pressure 138/84, pulse 85, temperature 98.5 F (36.9 C), temperature source Oral, resp. rate 18, height 5\' 7"  (1.702 m), weight 96.8 kg, SpO2 99 %, unknown if currently breastfeeding. Maternal Exam:  Uterine Assessment: Contraction strength is mild.  Contraction frequency is irregular.   Abdomen: Patient reports no abdominal tenderness. Fetal presentation: vertex  Introitus: Normal vulva. Normal vagina.  Ferning test: not done.  Nitrazine test: not done. Amniotic fluid character: not assessed.  Pelvis: adequate for delivery.   Cervix: Cervix evaluated by digital exam.     Physical Exam  Nursing note and vitals reviewed. Constitutional: She is oriented to person, place, and time. She appears well-developed and well-nourished.  HENT:  Head: Normocephalic and atraumatic.  Neck: Normal range of motion. Neck supple.  Cardiovascular: Normal rate and regular rhythm.  Respiratory: Effort normal and breath sounds normal.  GI: Soft. Bowel sounds are normal.  Genitourinary: Vagina normal and uterus normal.  Musculoskeletal: Normal range of motion.  Neurological: She is alert and oriented to person, place, and time. She has normal reflexes.  Skin: Skin is warm and dry.  Psychiatric: She has a normal mood and affect.    Prenatal labs: ABO, Rh: --/--/A NEG (10/29 0107) Antibody: POS (10/29 0107) Rubella: Immune (03/21 0000) RPR: Nonreactive (03/21 0000)  HBsAg: Negative (03/21 0000)  HIV: Non-reactive (03/21 0000)  GBS: Negative (09/30 0000)   Assessment/Plan: History of IUFD Protein S deficiency on lovenox GDM- well controlled IOL   Kerry Duncan J 11/27/2017, 6:35 AM

## 2017-11-27 NOTE — Anesthesia Preprocedure Evaluation (Signed)
Anesthesia Evaluation  Patient identified by MRN, date of birth, ID band Patient awake    Reviewed: Allergy & Precautions, H&P , NPO status , Patient's Chart, lab work & pertinent test results  History of Anesthesia Complications Negative for: history of anesthetic complications  Airway Mallampati: II  TM Distance: >3 FB Neck ROM: full    Dental no notable dental hx. (+) Teeth Intact   Pulmonary asthma , former smoker,    Pulmonary exam normal breath sounds clear to auscultation       Cardiovascular negative cardio ROS Normal cardiovascular exam Rhythm:regular Rate:Normal     Neuro/Psych Seizures -,  negative psych ROS   GI/Hepatic negative GI ROS, Neg liver ROS,   Endo/Other  negative endocrine ROS  Renal/GU negative Renal ROS  negative genitourinary   Musculoskeletal   Abdominal   Peds  Hematology  (+) Blood dyscrasia (Protein S deficiency. Off lovenox since 10/27), ,   Anesthesia Other Findings   Reproductive/Obstetrics (+) Pregnancy                             Anesthesia Physical Anesthesia Plan  ASA: II  Anesthesia Plan: Epidural   Post-op Pain Management:    Induction:   PONV Risk Score and Plan:   Airway Management Planned:   Additional Equipment:   Intra-op Plan:   Post-operative Plan:   Informed Consent: I have reviewed the patients History and Physical, chart, labs and discussed the procedure including the risks, benefits and alternatives for the proposed anesthesia with the patient or authorized representative who has indicated his/her understanding and acceptance.     Plan Discussed with:   Anesthesia Plan Comments:         Anesthesia Quick Evaluation

## 2017-11-27 NOTE — Lactation Note (Signed)
This note was copied from a baby's chart. Lactation Consultation Note  Patient Name: Kerry Duncan Date: 11/27/2017 Reason for consult: Initial assessment;Maternal endocrine disorder;Term;1st time breastfeeding;Primapara Type of Endocrine Disorder?: Diabetes  4 hours old FT female who is being exclusively BF by his mother, she's a P1. Mom has a Medela DEBP coming in the mail through her insurance. She doesn't know how to hand express yet, but when [facility] offered assistance with hand expression, mom asked her to come back at a later time because baby wanted to rest (she had baby STS). Mom got two different supplements OTC for BF; one of them contains [employer] Customer service manager) and the other one contains black cumin and fennel seed (Pump Princess); she seems very committed to BF.  LC shared literature about galactagogues (fenugreek, moringa and oatmeal cookie recipe) and reviewed some BF basics. LC also asked mom to call for latch assistance the next time baby is ready to feed because mom also declined assistance with latch for the same reason. RN had already brought in the room breast shells and a hand pump. Noticed that mom has semi-flat nipples; discussed instructions for both. She brought a nursing bra to the hospital.  Feeding plan:  1. Encouraged mom to feed baby STS 8-12 times/24 hours or sooner if feeding cues are present 2. Hand expression (once taught) and spoon feeding was also encouraged 3. Mom will start wearing her shells tomorrow morning, daytime only 4. She'll pre-pump prior feedings to help evert her nipples and prime her milk ducts  BF brochure, BF resources and feeding diary were reviewed. Parents reported all questions and concerns were answered, they're both aware of LC services and will call PRN.  Maternal Data Formula Feeding for Exclusion: No Has patient been taught Hand Expression?: No(LC offered but mom asked her to come back at a later time to do so) Does the patient  have breastfeeding experience prior to this delivery?: No  Feeding Feeding Type: Breast Fed  LATCH Score Latch: Grasps breast easily, tongue down, lips flanged, rhythmical sucking.  Audible Swallowing: None  Type of Nipple: Flat  Comfort (Breast/Nipple): Soft / non-tender  Hold (Positioning): Assistance needed to correctly position infant at breast and maintain latch.  LATCH Score: 6  Interventions Interventions: Breast feeding basics reviewed;Skin to skin;Hand pump;Shells  Lactation Tools Discussed/Used Tools: Shells;Pump Shell Type: Inverted Breast pump type: Manual WIC Program: No   Consult Status Consult Status: Follow-up Date: 11/28/17 Follow-up type: In-patient    Kerry Duncan 11/27/2017, 5:32 PM

## 2017-11-27 NOTE — Progress Notes (Signed)
Labs reviewed with dr Billy Coast

## 2017-11-27 NOTE — Anesthesia Pain Management Evaluation Note (Signed)
  CRNA Pain Management Visit Note  Patient: Kerry Duncan, 37 y.o., female  "Hello I am a member of the anesthesia team at [facility]. We have an anesthesia team available at all times to provide care throughout the hospital, including epidural management and anesthesia for C-section. I don't know your plan for the delivery whether it a natural birth, water birth, IV sedation, nitrous supplementation, doula or epidural, but we want to meet your pain goals."   1.Was your pain managed to your expectations on prior hospitalizations?   Unable to assess - patient sleeping  2.What is your expectation for pain management during this hospitalization?     Epidural  3.How can we help you reach that goal? *support**  Record the patient's initial score and the patient's pain goal.   Pain: Patient sleeping - unable to assess  Pain Goal: Patient sleeping - unable to assess [facility] wants you to be able to say your pain was always managed very well.  Trellis Paganini 11/27/2017

## 2017-11-27 NOTE — Progress Notes (Signed)
QBL of discussed with dr Billy Coast, vital signs wnl. Stage 1 PPH. Pt asymptomatic. MD does not wish for any further testing at this time. Will monitor.

## 2017-11-27 NOTE — Progress Notes (Signed)
Dr Billy Coast called regarding vital signs. Last bp of 141/90 and HR in 120s. Temp 98.8. CMP ordered. CBC reviewed with Dr Billy Coast. No other orders at this time. MD ok with transfer to mother baby unit.

## 2017-11-27 NOTE — Progress Notes (Signed)
Kerry Duncan is a 37 y.o. G3P0110 at [redacted]w[redacted]d by LMP admitted for induction of labor due to Gestational diabetes and history of IUFD .  Subjective: comfortable  Objective: BP 115/62   Pulse 71   Temp 97.8 F (36.6 C) (Oral)   Resp 18   Ht 5\' 7"  (1.702 m)   Wt 96.8 kg   SpO2 99%   BMI 33.41 kg/m  No intake/output data recorded. No intake/output data recorded.  FHT:  FHR: 155 bpm, variability: moderate,  accelerations:  Present,  decelerations:  Absent UC:   irregular, every 4 minutes SVE:   5-6/70/-1/OT IUPC place  Labs: Lab Results  Component Value Date   WBC 9.6 11/27/2017   HGB 12.1 11/27/2017   HCT 35.1 (L) 11/27/2017   MCV 91.9 11/27/2017   PLT 212 11/27/2017    Assessment / Plan: Induction of labor due to gestational diabetes and h/o IUFD,  progressing well on pitocin  Labor: Progressing normally Preeclampsia:  no signs or symptoms of toxicity Fetal Wellbeing:  Category I Pain Control:  Epidural I/D:  n/a Anticipated MOD:  NSVD  Kerry Duncan J 11/27/2017, 8:35 AM

## 2017-11-28 DIAGNOSIS — O24410 Gestational diabetes mellitus in pregnancy, diet controlled: Secondary | ICD-10-CM

## 2017-11-28 DIAGNOSIS — D62 Acute posthemorrhagic anemia: Secondary | ICD-10-CM

## 2017-11-28 LAB — TYPE AND SCREEN
ABO/RH(D): A NEG
Antibody Screen: POSITIVE
UNIT DIVISION: 0
Unit division: 0

## 2017-11-28 LAB — CBC
HCT: 28.8 % — ABNORMAL LOW (ref 36.0–46.0)
Hemoglobin: 9.9 g/dL — ABNORMAL LOW (ref 12.0–15.0)
MCH: 31.3 pg (ref 26.0–34.0)
MCHC: 34.4 g/dL (ref 30.0–36.0)
MCV: 91.1 fL (ref 80.0–100.0)
NRBC: 0 % (ref 0.0–0.2)
PLATELETS: 186 10*3/uL (ref 150–400)
RBC: 3.16 MIL/uL — AB (ref 3.87–5.11)
RDW: 14.8 % (ref 11.5–15.5)
WBC: 12.2 10*3/uL — ABNORMAL HIGH (ref 4.0–10.5)

## 2017-11-28 LAB — BPAM RBC
BLOOD PRODUCT EXPIRATION DATE: 201911222359
Blood Product Expiration Date: 201911242359
ISSUE DATE / TIME: 201910301121
Unit Type and Rh: 600
Unit Type and Rh: 600

## 2017-11-28 MED ORDER — POLYSACCHARIDE IRON COMPLEX 150 MG PO CAPS
150.0000 mg | ORAL_CAPSULE | Freq: Every day | ORAL | Status: DC
  Administered 2017-11-29: 150 mg via ORAL
  Filled 2017-11-28 (×2): qty 1

## 2017-11-28 MED ORDER — RHO D IMMUNE GLOBULIN 1500 UNIT/2ML IJ SOSY
300.0000 ug | PREFILLED_SYRINGE | Freq: Once | INTRAMUSCULAR | Status: AC
  Administered 2017-11-28: 300 ug via INTRAVENOUS
  Filled 2017-11-28: qty 2

## 2017-11-28 MED ORDER — MAGNESIUM OXIDE 400 (241.3 MG) MG PO TABS
400.0000 mg | ORAL_TABLET | Freq: Every day | ORAL | Status: DC
  Administered 2017-11-29: 400 mg via ORAL
  Filled 2017-11-28 (×3): qty 1

## 2017-11-28 NOTE — Progress Notes (Signed)
MOB was referred for history of depression/anxiety. * Referral screened out by Clinical Social Worker because none of the following criteria appear to apply: ~ History of anxiety/depression during this pregnancy, or of post-partum depression following prior delivery. ~ Diagnosis of anxiety and/or depression within last 3 years OR * MOB's symptoms currently being treated with medication and/or therapy. Please contact the Clinical Social Worker if needs arise, by MOB request, or if MOB scores greater than 9/yes to question 10 on Edinburgh Postpartum Depression Screen.  [name], LCSW Clinical Social Worker  [facility]  [phone]  

## 2017-11-28 NOTE — Anesthesia Postprocedure Evaluation (Signed)
Anesthesia Post Note  Patient: Kerry Duncan  Procedure(s) Performed: AN AD HOC LABOR EPIDURAL     Patient location during evaluation: Mother Baby Anesthesia Type: Epidural Level of consciousness: awake, awake and alert and oriented Pain management: pain level controlled Vital Signs Assessment: post-procedure vital signs reviewed and stable Respiratory status: spontaneous breathing, nonlabored ventilation and respiratory function stable Cardiovascular status: stable Postop Assessment: no headache, no backache, able to ambulate, adequate PO intake, no apparent nausea or vomiting and patient able to bend at knees Anesthetic complications: no    Last Vitals:  Vitals:   11/28/17 0230 11/28/17 0540  BP: (!) 142/89 (!) 146/85  Pulse: (!) 101 (!) 109  Resp:  18  Temp:  36.9 C  SpO2:  98%    Last Pain:  Vitals:   11/28/17 0942  TempSrc:   PainSc: 8    Pain Goal: Patients Stated Pain Goal: 3 (11/28/17 0230)               [name]

## 2017-11-28 NOTE — Progress Notes (Addendum)
PPD #1, SVD, 2nd degree repair, baby boy  S:  Reports feeling okay, tired and sore from repair             Tolerating po/ No nausea or vomiting / Denies dizziness or SOB             Bleeding is moderate             Pain controlled with Percocet             Up ad lib / ambulatory / voiding QS  Newborn breast feeding, pumping and supplementing with formulat  / Circumcision - completed today   O:               VS: BP (!) 146/85 (BP Location: Left Arm)   Pulse (!) 109   Temp 98.5 F (36.9 C) (Oral)   Resp 18   Ht 5\' 7"  (1.702 m)   Wt 96.8 kg   SpO2 98%   Breastfeeding? Unknown   BMI 33.41 kg/m   Today's Vitals   11/28/17 0540 11/28/17 0541 11/28/17 0720 11/28/17 0942  BP: (!) 146/85     Pulse: (!) 109     Resp: 18     Temp: 98.5 F (36.9 C)     TempSrc: Oral     SpO2: 98%     Weight:      Height:      PainSc:  7  Asleep 8     LABS:              Recent Labs    11/27/17 1402 11/28/17 0524  WBC 13.6* 12.2*  HGB 12.0 9.9*  PLT 172 186               Blood type: --/--/A NEG (10/29 0107)  Rubella: Immune (03/21 0000)                     I&O: Intake/Output      10/29 0701 - 10/30 0700 10/30 0701 - 10/31 0700   Urine (mL/kg/hr) 950 (0.4)    Blood 1000    Total Output 1950    Net -1950                       Physical Exam:             Alert and oriented X3  Lungs: Clear and unlabored  Heart: regular rate and rhythm / no murmurs  Abdomen: soft, non-tender, non-distended              Fundus: firm, non-tender, U-2  Perineum: well approximated 2nd degree laceration, no edema, no ecchymosis, no evidence of a hematoma, no erythema   Lochia: small  Extremities: trace BLE edema, no calf pain or tenderness    A: PPD # 1, SVD  2nd degree perineal   ABL Anemia   RH Negative, baby A Positive  Protein S and C deficiency - on Lovenox   Postpartum hypertension   Doing well - stable status  P: Routine post partum orders  Rhogam today   Niferex 150mg  PO daily  Magnesium  oxide 400mg  PO daily  Plans to f/u with hematology for anemia and Protein deficiency   Continue to monitor BPs closely; normal labs  Anticipate discharge home tomorrow   Carlean Jews, MSN, CNM [facility]

## 2017-11-28 NOTE — Lactation Note (Signed)
This note was copied from a baby's chart. Lactation Consultation Note  Patient Name: Kerry Duncan [id number] Date: 11/28/2017 Reason for consult: Follow-up assessment;1st time breastfeeding;Primapara;Term  Visited with P1 Mom of 12 hr old term baby that was circ'd this am.  Baby at 4% weight loss, and has breastfed >8 times first 24 hrs of life. Mom opted to offer supplement of formula by curved tip syringe due to baby acting fussy after breastfeeding on both breasts.    Reviewed normal newborn feeding behavior, and how babies become more hungry in second day of life.  Cluster feeding discussed.  RN set up DEBP and Mom pumped once.  Reassured her as Mom states she didn't get any colostrum, encouraged breast massage and hand expression.    Encouraged STS and feeding baby often on cue.  Reviewed importance of 8-12 feedings per 24 hrs.    Offered to assist, and Mom to call prn as she is waiting on photographer.  Interventions Interventions: Breast feeding basics reviewed;Skin to skin;Breast massage;Hand express;DEBP  Lactation Tools Discussed/Used Tools: Pump Shell Type: Inverted Breast pump type: Double-Electric Breast Pump   Consult Status Consult Status: Follow-up Date: 11/29/17 Follow-up type: In-patient    Kerry Duncan 11/28/2017, 3:29 PM

## 2017-11-29 LAB — RH IG WORKUP (INCLUDES ABO/RH)
ABO/RH(D): A NEG
FETAL SCREEN: NEGATIVE
GESTATIONAL AGE(WKS): 39
Unit division: 0

## 2017-11-29 MED ORDER — OXYCODONE HCL 5 MG PO TABS: 5.0000 mg | ORAL_TABLET | ORAL | 0 refills | Status: DC | PRN

## 2017-11-29 NOTE — Progress Notes (Addendum)
PPD #2, SVD, 2nd degree repair, baby boy  S:         Reports feeling okay, tired and sore from repair; also reports severe tailbone pain  Reports mild headache from lack of sleep; does not think it is BP related; denies visual changes, RUQ/epigastric pain              Tolerating po/ No nausea or vomiting / Denies dizziness or SOB             Bleeding is moderate             Pain controlled with Percocet - cannot take Motrin; requesting Oxycodone IR so she can take more Tylenol              Up ad lib / ambulatory / voiding QS             Newborn breast feeding, pumping and supplementing with formula  / Circumcision - completed yesterday  O:                     VS: Blood pressure 131/75, pulse 91, temperature 99.2 F (37.3 C), temperature source Oral, resp. rate 20, height 5\' 7"  (1.702 m), weight 96.8 kg, SpO2 99 %, unknown if currently breastfeeding.             LABS:              RecentLabs(last2labs)      Recent Labs    11/27/17 1402 11/28/17 0524  WBC 13.6* 12.2*  HGB 12.0 9.9*  PLT 172 186                 Blood type: --/--/A NEG (10/29 0107)             Rubella: Immune (03/21 0000)                                I&O: Intake/Output      10/29 0701 - 10/30 0700 10/30 0701 - 10/31 0700   Urine (mL/kg/hr) 950 (0.4)    Blood 1000    Total Output 1950    Net -1950                       Physical Exam:             Alert and oriented X3             Lungs: Clear and unlabored             Heart: regular rate and rhythm / no murmurs             Abdomen: soft, non-tender, non-distended              Fundus: firm, non-tender, U-2             Perineum: well approximated 2nd degree laceration, no edema, no ecchymosis, no evidence of a hematoma, no erythema              Lochia: small             Extremities: trace BLE edema, no calf pain or tenderness               A:         PPD # 1, SVD             2nd degree perineal  ABL Anemia              RH Negative,  baby A Positive             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

## 2017-11-29 NOTE — Lactation Note (Signed)
This note was copied from a baby's chart. Lactation Consultation Note  Patient Name: Kerry Duncan [id number] Date: 11/29/2017 Reason for consult: Follow-up assessment;Infant weight loss(7% weight loss ) Type of Endocrine Disorder?: Diabetes(per mom GDM )  Baby is 97 hours old.  As LC entered the room , baby resting in the crib asleep.  Mom eating breakfast and dad just finished soothing baby.  LC reviewed and updated the doc flow sheets per mom .  Per mom the baby cluster for long periods all night and I asked for  Formula, and felt the baby needed more than I could give him .  Breast are fuller today, nipples sensitive and tender from the baby  Cluster feeding. Mom declined assessment of sore nipples.  LC noted a hand pump and DEBP  set up in the room, and shells.  LC explored options with mom and recommended prior to the 1st Latch - breast massage, hand express, pre- pump with hand pump  To prime the milk ducts, and latch with breast compressions until  Swallows and then intermittent.  Offer both breast and if satisfied hold off on supplementing, if not  Keep the supplementing down to 30 ml, so the baby will come back  In 2-3 hours for breast stimulation.  Per mom will have a DEBP at home. LC recommended after getting  Some much needed rest , when the baby isn't cluster feeding, post pump  For 10 -15 mins , save milk to supplement back to baby.  LC reviewed PACE feeding/ importance of FISH lips.  Sore nipple and engorgement prevention and tx reviewed.  S/S of mastitis, importance of rotating breast feeding positions at least  Between 2 ( prevention ) , if the baby only feeds 1st breast , and the other one is full Release to comfort.  Mother informed of post-discharge support and given phone number to the lactation department, including services for phone call assistance; out-patient appointments; and breastfeeding support group. List of other breastfeeding resources in the  community given in the handout. Encouraged mother to call for problems or concerns related to breastfeeding.    Maternal Data Has patient been taught Hand Expression?: Yes  Feeding Feeding Type: Bottle Fed - Formula(mom mentioned she had requested ) Nipple Type: Slow - flow  LATCH Score                   Interventions Interventions: Breast feeding basics reviewed  Lactation Tools Discussed/Used Pump Review: Milk Storage Initiated by:: MAI / reviewed  Date initiated:: 11/29/17   Consult Status Consult Status: Complete Date: 11/29/17    Kathrin Greathouse 11/29/2017, 9:32 AM

## 2017-11-29 NOTE — Discharge Summary (Signed)
Obstetric Discharge Summary   Patient Name: Kerry Duncan DOB: 03-17-1980 MRN: [id number]  Date of Admission: 11/27/2017 Date of Discharge: 11/29/2017 Date of Delivery: 11/27/17 Gestational Age at Delivery: [redacted]w[redacted]d  Primary OB: [facility] - Dr. Billy Coast  Antepartum complications:  - RH negative - Hx. 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 SVD (spontaneous vaginal delivery) 11/28/2017  . Second-degree perineal laceration, with delivery 11/28/2017  . Postpartum care following vaginal delivery (10/29) 11/28/2017  . Gestational diabetes mellitus, class A1 11/28/2017  . Acute blood loss anemia 11/28/2017  . Encounter for planned induction of labor 11/27/2017  . High-risk pregnancy 04/24/2017  . Anemia, iron deficiency 04/24/2017  . Respiratory failure with hypoxia (HCC) 02/25/2012  . Influenza-like illness 02/25/2012  . Acute asthma exacerbation 02/25/2012  . Protein S deficiency (HCC)     Augmentation: Cytotec, AROM, Pitocin Complications: None  Date of Delivery: 11/27/17 Delivered By: Dr. Billy Coast Delivery Type: spontaneous vaginal delivery Anesthesia: epidural Placenta: sponatneous Laceration: 2nd degree Episiotomy: none  Newborn Data: Live born female  Birth Weight: 6 lb 15.8 oz (3170 g) APGAR: 9, 9  Newborn Delivery   Birth date/time:  11/27/2017 13:13:00 Delivery type:  Vaginal, Spontaneous        Hospital/Postpartum Course  (Vaginal Delivery): Pt. Admitted for IOL for Protein S deficiency, A1GDM, and hx of IUFD,. Postpartum, She developed  mild range BPs and a rare severe range BP without neural s/s and labs were stable.  Pt. Believes it was pain related..  By time of discharge on PPD#2, her pain was controlled on oral pain medications; she had appropriate lochia and was ambulating, voiding without difficulty and tolerating regular diet.  She was deemed stable for discharge to home.    Labs: CBC Latest Ref Rng & Units 11/28/2017 11/27/2017 11/27/2017  WBC 4.0 - 10.5 K/uL 12.2(H) 13.6(H) 9.6  Hemoglobin 12.0 - 15.0 g/dL 3.0(Q) 65.7 84.6  Hematocrit 36.0 - 46.0 % 28.8(L) 34.5(L) 35.1(L)  Platelets 150 - 400 K/uL 186 172 212   A NEG  Physical exam:  BP 131/75   Pulse 91   Temp 99.2 F (37.3 C) (Oral)   Resp 20   Ht 5\' 7"  (1.702 m)   Wt 96.8 kg   SpO2 99%   Breastfeeding? Unknown   BMI 33.41 kg/m  General: alert and no distress Pulm: normal respiratory effort Lochia: appropriate Abdomen: soft, NT Uterine Fundus: firm, below umbilicus Perineum: healing well, no significant erythema, no significant edema Incision: c/d/i, healing well, no significant drainage, no dehiscence, no significant erythema Extremities: No evidence of DVT seen on physical exam. No lower extremity edema.   Disposition: stable, discharge to home Baby Feeding: breast milk and formula Baby Disposition: home with mom  Contraception: discussed, but undecided  Rh Immune globulin given: given 11/28/17 Rubella vaccine given: N/A Tdap vaccine given in AP or PP setting: UTD Flu vaccine given in AP or PP setting: Declined due to egg allergy   Plan:  Kerry Duncan was discharged to home in good condition. Follow-up appointment at [facility] in  1 week for a BP check, then 6 weeks for PP visit  Discharge Instructions: Per After Visit Summary. Refer to After Visit Summary and [facility] discharge booklet  Activity: Advance as tolerated. Pelvic rest for 6 weeks.   Diet: Regular, Heart Healthy Discharge Medications: Allergies as of  11/29/2017      Reactions   Topamax [topiramate] Other (See Comments)   Caused blurred vision, hair loss, weight loss   Aleve [naproxen Sodium] Nausea And Vomiting   Burns stomach   Eggs Or Egg-derived Products Swelling, Other (See Comments)   Stomach cramping   Mobic [meloxicam] Other (See Comments)   Ulcers.   Keppra [levetiracetam] Anxiety, Other (See Comments)   Causes suicidal ideation.      Medication List    STOP taking these medications   butalbital-acetaminophen-caffeine 50-325-40-30 MG capsule Commonly known as:  FIORICET WITH CODEINE   doxylamine (Sleep) 25 MG tablet Commonly known as:  UNISOM     TAKE these medications   acetaminophen 500 MG tablet Commonly known as:  TYLENOL Take 1,000 mg by mouth every 6 (six) hours as needed for mild pain.   albuterol 108 (90 Base) MCG/ACT inhaler Commonly known as:  PROVENTIL HFA;VENTOLIN HFA Inhale 2 puffs into the lungs every 4 (four) hours as needed. For wheezing   enoxaparin 40 MG/0.4ML injection Commonly known as:  LOVENOX Inject 40 mg into the skin daily.   oxyCODONE 5 MG immediate release tablet Commonly known as:  Oxy IR/ROXICODONE Take 1 tablet (5 mg total) by mouth every 4 (four) hours as needed for severe pain.   Prenatal Vitamin 27-0.8 MG Tabs Take 1 capsule by mouth daily.   PULMICORT FLEXHALER 180 MCG/ACT inhaler Generic drug:  budesonide Inhale 1 puff into the lungs 2 (two) times daily.            Discharge Care Instructions  (From admission, onward)         Start     Ordered   11/29/17 0000  Discharge wound care:    Comments:  Sitz baths 3 times daily as needed   11/29/17 1148         Outpatient follow up:  Follow-up Information    Olivia Mackie, MD. Schedule an appointment as soon as possible for a visit in 1 week(s).   Specialty:  Obstetrics and Gynecology Why:  Blood pressure check then 6 week Postpartum visit  Contact information: [address] [place]  [address] [phone]           Signed:  Carlean Jews, MSN, CNM [facility]

## 2018-01-01 ENCOUNTER — Other Ambulatory Visit: Payer: Self-pay

## 2018-01-01 ENCOUNTER — Other Ambulatory Visit: Payer: Self-pay | Admitting: Oncology

## 2018-01-01 ENCOUNTER — Ambulatory Visit (INDEPENDENT_AMBULATORY_CARE_PROVIDER_SITE_OTHER): Payer: BLUE CROSS/BLUE SHIELD | Admitting: Oncology

## 2018-01-01 ENCOUNTER — Encounter: Payer: Self-pay | Admitting: Oncology

## 2018-01-01 VITALS — BP 144/95 | HR 92 | Temp 98.1°F | Ht 67.0 in | Wt 196.8 lb

## 2018-01-01 DIAGNOSIS — D6859 Other primary thrombophilia: Secondary | ICD-10-CM

## 2018-01-01 DIAGNOSIS — Z3A08 8 weeks gestation of pregnancy: Secondary | ICD-10-CM

## 2018-01-01 DIAGNOSIS — G40909 Epilepsy, unspecified, not intractable, without status epilepticus: Secondary | ICD-10-CM | POA: Diagnosis not present

## 2018-01-01 DIAGNOSIS — O0990 Supervision of high risk pregnancy, unspecified, unspecified trimester: Secondary | ICD-10-CM

## 2018-01-01 DIAGNOSIS — D509 Iron deficiency anemia, unspecified: Secondary | ICD-10-CM | POA: Diagnosis not present

## 2018-01-01 DIAGNOSIS — Z91012 Allergy to eggs: Secondary | ICD-10-CM

## 2018-01-01 DIAGNOSIS — O9913 Other diseases of the blood and blood-forming organs and certain disorders involving the immune mechanism complicating the puerperium: Secondary | ICD-10-CM

## 2018-01-01 DIAGNOSIS — O99355 Diseases of the nervous system complicating the puerperium: Secondary | ICD-10-CM

## 2018-01-01 DIAGNOSIS — Z7901 Long term (current) use of anticoagulants: Secondary | ICD-10-CM

## 2018-01-01 DIAGNOSIS — G43909 Migraine, unspecified, not intractable, without status migrainosus: Secondary | ICD-10-CM

## 2018-01-01 DIAGNOSIS — O9903 Anemia complicating the puerperium: Secondary | ICD-10-CM

## 2018-01-01 DIAGNOSIS — N96 Recurrent pregnancy loss: Secondary | ICD-10-CM

## 2018-01-01 DIAGNOSIS — J45909 Unspecified asthma, uncomplicated: Secondary | ICD-10-CM

## 2018-01-01 DIAGNOSIS — Z888 Allergy status to other drugs, medicaments and biological substances status: Secondary | ICD-10-CM

## 2018-01-01 NOTE — Progress Notes (Signed)
Hematology and Oncology Follow Up Visit  Kerry Duncan [id number] Oct 26, 1980 37 y.o. 01/01/2018 1:42 PM   Principle Diagnosis: Encounter Diagnoses  Name Primary?  . High risk pregnancy, antepartum   . Iron deficiency anemia, unspecified iron deficiency anemia type   . Protein S deficiency (HCC) Yes  Clinical summary: 37 year old woman I saw back in 2008.  She was first pregnant at age 75.  She carried until 26 weeks and 6 days and then had a stillbirth.  ANA negative.  Lupus anticoagulant not detected.  Anticardiolipin antibodies negative.  She became pregnant again at age 66 in 2008.  She states her hCG never doubled and she had a first trimester loss labeled missed abortion versus ectopic pregnancy and underwent a D&C procedure in April 2008.  I evaluated her for possible thrombophilia.  She had normal protein C total and functional.  She had borderline decrease in protein S activity 68% of control value in March 2008 with a repeat value 89% with reference range 91-147.  Free protein S levels were low at 21%, repeat 18% reference range 50-147. She and her husband tried unsuccessfully for the next few years to get pregnant and underwent a infertility evaluation.  They were just considering adoption when she got pregnant on her own and is now at 8 weeks with a due date December 03, 2017.  Her OB/GYN started her on prophylactic dose Lovenox 40 mg daily.  She was also noted to be anemic with hemoglobin 7.8, MCV 73 on January 29.  She is currently on a prenatal vitamin started 3 weeks ago.  Only other interim medical problem was development of a seizure disorder in 2011.    Of interest she has a sister who also developed a seizure disorder.  She was on Keppra but had to stop it since it was causing suicidal ideation.  She recently saw a new neurologist who is  recommending Lamictal but she is hesitant to start until the second trimester.  She has about 1 seizure a year.  Last year she had 2.  She  also has a history of migraine headaches and asthma.  She uses as needed Maxalt for the headaches and she uses a as needed inhaler for the asthma.  In reviewing her family history again: Father died at age 33 of complications of heart failure.  Father's sister was on blood thinners during pregnancy for unknown reasons.  Mother started having strokes at age 82.  Maternal grandfather died in his 46s also had a history of strokes.  She has a brother aged 51, sister age 47 who has 4 children, sister age 73 who has 2 children and no one else in the family has had problems with blood clots.  Interim History:   She had an uncomplicated vaginal delivery on November 27, 2017.  She has a healthy boy.  She has 1 week left to continue prophylactic dose Lovenox to complete 6 weeks postpartum.  She had about a 2 g fall in hemoglobin.  I was giving her parenteral iron during the pregnancy. She has not started back on antiseizure medication yet.  She is still taking a prenatal vitamin and I encouraged her to continue this. She has no cardiorespiratory symptoms.  No leg pain or swelling. Despite recurrent pregnancy loss, she never had a thrombotic event.  Medications: reviewed  Allergies:  Allergies  Allergen Reactions  . Topamax [Topiramate] Other (See Comments)    Caused blurred vision, hair loss, weight loss  .  Aleve [Naproxen Sodium] Nausea And Vomiting    Burns stomach  . Eggs Or Egg-Derived Products Swelling and Other (See Comments)    Stomach cramping  . Mobic [Meloxicam] Other (See Comments)    Ulcers.  . Keppra [Levetiracetam] Anxiety and Other (See Comments)    Causes suicidal ideation.    Review of Systems: See interim history Remaining ROS negative:   Physical Exam: Blood pressure (!) 144/95, pulse 92, temperature 98.1 F (36.7 C), temperature source Oral, height 5\' 7"  (1.702 m), weight 196 lb 12.8 oz (89.3 kg), SpO2 99 %, unknown if currently breastfeeding. Wt Readings from Last 3  Encounters:  01/01/18 196 lb 12.8 oz (89.3 kg)  11/27/17 213 lb 4.8 oz (96.8 kg)  11/19/17 211 lb 11.2 oz (96 kg)     General appearance: Well-nourished Caucasian woman HENNT: Pharynx no erythema, exudate, mass, or ulcer. No thyromegaly or thyroid nodules Lymph nodes: No cervical, supraclavicular, or axillary lymphadenopathy Breasts:  Lungs: Clear to auscultation, resonant to percussion throughout Heart: Regular rhythm, no murmur, no gallop, no rub, no click, no edema Abdomen: Soft, nontender, normal bowel sounds, no mass, no organomegaly Extremities: No edema, no calf tenderness Musculoskeletal: no joint deformities GU:  Vascular: Carotid pulses 2+,  Neurologic: Alert, oriented, PERRLA, , cranial nerves grossly normal, motor strength 5 over 5, reflexes 1+ symmetric, upper body coordination normal, gait normal, Skin: No rash or ecchymosis  Lab Results: CBC W/Diff    Component Value Date/Time   WBC 12.2 (H) 11/28/2017 0524   RBC 3.16 (L) 11/28/2017 0524   HGB 9.9 (L) 11/28/2017 0524   HGB 11.7 10/29/2017 0839   HGB 12.2 04/25/2006 0955   HCT 28.8 (L) 11/28/2017 0524   HCT 34.3 10/29/2017 0839   HCT 34.3 (L) 04/25/2006 0955   PLT 186 11/28/2017 0524   PLT 191 10/29/2017 0839   MCV 91.1 11/28/2017 0524   MCV 90 10/29/2017 0839   MCV 85.3 04/25/2006 0955   MCH 31.3 11/28/2017 0524   MCHC 34.4 11/28/2017 0524   RDW 14.8 11/28/2017 0524   RDW 12.9 10/29/2017 0839   RDW 12.6 04/25/2006 0955   LYMPHSABS 1.1 11/19/2017 0906   LYMPHSABS 0.9 10/29/2017 0839   LYMPHSABS 0.9 04/25/2006 0955   MONOABS 0.6 11/19/2017 0906   MONOABS 0.3 04/25/2006 0955   EOSABS 0.2 11/19/2017 0906   EOSABS 0.2 10/29/2017 0839   BASOSABS 0.0 11/19/2017 0906   BASOSABS 0.0 10/29/2017 0839   BASOSABS 0.0 04/25/2006 0955     Chemistry      Component Value Date/Time   NA 135 11/27/2017 1402   NA 139 10/29/2017 0839   K 3.9 11/27/2017 1402   CL 105 11/27/2017 1402   CO2 22 11/27/2017 1402   BUN  6 11/27/2017 1402   BUN 8 10/29/2017 0839   CREATININE 0.43 (L) 11/27/2017 1402      Component Value Date/Time   CALCIUM 8.4 (L) 11/27/2017 1402   ALKPHOS 100 11/27/2017 1402   AST 22 11/27/2017 1402   ALT 18 11/27/2017 1402   BILITOT 0.6 11/27/2017 1402   BILITOT <0.2 10/29/2017 [id number]       Radiological Studies: No results found.  Impression:  1.  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 She has never had a thrombotic event.  I have only used anticoagulation prophylactically during pregnancy attempts. I advised her to use prophylactic anticoagulation around any future surgeries or pregnancies but I am not going to put  her on a long-term anticoagulant.  2.  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 Now that she is no longer pregnant and not back on an anticonvulsant yet, I am under repeat protein S studies at this time.  3.  Idiopathic seizure disorder  4.  Migraine headache  5.  Iron deficiency anemia She received parenteral iron during the pregnancy.  I told her to continue her prenatal vitamin which also contains iron.  6.  Asthma with infrequent exacerbations using a as needed bronchodilator.  I am going to transition her hematology care to 1 of the doctors at the [facility] cancer center.  CC: Patient Care Team: Olevia Perches as PCP - General (Internal Medicine) Levert Feinstein, MD as Consulting Physician (Oncology) Olivia Mackie, MD as Consulting Physician (Obstetrics and Gynecology)   Cephas Darby, MD, FACP  Hematology-Oncology/Internal Medicine     12/3/20191:42 PM

## 2018-01-01 NOTE — Patient Instructions (Addendum)
To lab today Return visit as needed before April, 2019  We will make a referral [facility] for your follow up Hematology care. If you do not hear anything by end of January, call the cancer center at [phone]

## 2018-01-03 LAB — CBC WITH DIFFERENTIAL/PLATELET
BASOS: 1 %
Basophils Absolute: 0 10*3/uL (ref 0.0–0.2)
EOS (ABSOLUTE): 0.5 10*3/uL — AB (ref 0.0–0.4)
Eos: 7 %
Hematocrit: 40.7 % (ref 34.0–46.6)
Hemoglobin: 13.5 g/dL (ref 11.1–15.9)
IMMATURE GRANULOCYTES: 0 %
Immature Grans (Abs): 0 10*3/uL (ref 0.0–0.1)
Lymphocytes Absolute: 1.4 10*3/uL (ref 0.7–3.1)
Lymphs: 20 %
MCH: 29 pg (ref 26.6–33.0)
MCHC: 33.2 g/dL (ref 31.5–35.7)
MCV: 88 fL (ref 79–97)
Monocytes Absolute: 0.5 10*3/uL (ref 0.1–0.9)
Monocytes: 7 %
NEUTROS PCT: 65 %
Neutrophils Absolute: 4.8 10*3/uL (ref 1.4–7.0)
PLATELETS: 267 10*3/uL (ref 150–450)
RBC: 4.65 x10E6/uL (ref 3.77–5.28)
RDW: 12.6 % (ref 12.3–15.4)
WBC: 7.2 10*3/uL (ref 3.4–10.8)

## 2018-01-03 LAB — PROTEIN S ACTIVITY: PROTEIN S ACTIVITY: 68 % (ref 63–140)

## 2018-01-03 LAB — FERRITIN: Ferritin: 33 ng/mL (ref 15–150)

## 2018-01-03 LAB — PROTEIN S, TOTAL: Protein S Ag, Total: 74 % (ref 60–150)

## 2018-01-03 LAB — PROTEIN S, ANTIGEN, FREE: Protein S Ag, Free: 24 % — ABNORMAL LOW (ref 57–157)

## 2018-01-04 ENCOUNTER — Telehealth: Payer: Self-pay | Admitting: *Deleted

## 2018-01-04 NOTE — Telephone Encounter (Signed)
-----   Message from Levert FeinsteinJames M Granfortuna, MD sent at 01/03/2018  3:39 PM EST ----- Call pt: Hemoglobin & iron stores now normal: Hb 13.5; Free Protein S is still low - similar to previous values

## 2018-01-04 NOTE — Telephone Encounter (Signed)
Pt called / informed "Hemoglobin & iron stores now normal: Hb 13.5; Free Protein S is still low - similar to previous values " per Dr Cyndie ChimeGranfortuna.

## 2018-04-01 ENCOUNTER — Encounter: Payer: Self-pay | Admitting: *Deleted

## 2018-06-11 ENCOUNTER — Telehealth: Payer: Self-pay | Admitting: Hematology

## 2018-06-11 NOTE — Telephone Encounter (Signed)
A new hem appt has been scheduled for the pt to see Dr. Candise Che on 6/4 at 10am. Letter mailed.

## 2018-07-03 NOTE — Progress Notes (Signed)
HEMATOLOGY/ONCOLOGY CONSULTATION NOTE  Date of Service: 07/04/2018  Patient Care Team: Olevia Perches as PCP - General (Internal Medicine) Levert Feinstein, MD as Consulting Physician (Oncology) Olivia Mackie, MD as Consulting Physician (Obstetrics and Gynecology)  CHIEF COMPLAINTS/PURPOSE OF CONSULTATION:  Protein S Deficiency  HISTORY OF PRESENTING ILLNESS:   Kerry Duncan is a wonderful 38 y.o. female who has been referred to [place] by Dr. Cephas Darby for evaluation and management of Protein S Deficiency. The pt reports that she is doing well overall.   The pt reports that she has been tired for a couple months. She received IV Iron in her most recent pregnancy with successful delivery in October 2019. She notes that she was taking PO Iron replacement without successful increase in her counts. She notes that she is no longer breast feeding. She has not had recent labs with PCP nor OBGYN. She notes that she had a two week long period two months ago. The pt denies taking PO iron or vitamins recently.  The pt notes that her most recent labs on 01/01/18 were drawn 10 weeks after her October delivery.  The pt notes that she has never had a blood clot, though a miscarriage in 2004 did reveal a clot in the umbilical cord. The pt denies having had a previous blood clot herself. She notes that she has not taken po hormone containing contraception in more than 10 years, as she was advised not to do this in light of her Protein S deficiency. She did not have concerns for bleeding during her most recent pregnancy.  She notes that she does not have a brother who has had a heart attack, despite the medical chart's record of this. She notes that her mother's side has had strokes in 60s or 70s. Her father died of CHF at age 28, and he was a smoker.  Most recent lab results (01/11/18) of CBC w/diff is as follows: all values are WNL except for Eosinophils abs at 500. 01/11/18 Ferritin  at 33 01/11/18 Protein S activity at 68% 01/11/18 Free Protein S Antigen at 24% 01/11/18 Total Protein S Antigen at 74%  On review of systems, pt reports feeling tired, recent two week period, and denies concerns for infections, and any other symptoms.  On PMHx the pt reports multiple miscarriages. On Social Hx the pt reports that she smokes a couple cigarettes each day. On Family Hx the pt reports father died of CHF at age 80 and denies siblings with early heart disease.   MEDICAL HISTORY:  Past Medical History:  Diagnosis Date   Anemia, iron deficiency 04/24/2017   Asthma    High-risk pregnancy 04/24/2017   Protein S deficiency 2 prior miscarriages: 1st a stillborn age 65, 2nd early miss <6 wks age 61   Protein C deficiency (HCC)    Protein S deficiency (HCC)    PTSD (post-traumatic stress disorder)    Seizure (HCC)     SURGICAL HISTORY: Past Surgical History:  Procedure Laterality Date   DILATION AND CURETTAGE OF UTERUS     MOUTH SURGERY      SOCIAL HISTORY: Social History   Socioeconomic History   Marital status: Married    Spouse name: Not on file   Number of children: Not on file   Years of education: Not on file   Highest education level: Not on file  Occupational History   Not on file  Social Needs   Financial resource strain: Not hard  at all   Food insecurity:    Worry: Never true    Inability: Never true   Transportation needs:    Medical: No    Non-medical: No  Tobacco Use   Smoking status: Former Smoker    Packs/day: 0.50    Types: Cigarettes   Smokeless tobacco: Never Used  Substance and Sexual Activity   Alcohol use: No   Drug use: No   Sexual activity: Yes    Birth control/protection: None  Lifestyle   Physical activity:    Days per week: 1 day    Minutes per session: 30 min   Stress: Only a little  Relationships   Social connections:    Talks on phone: More than three times a week    Gets together: Once a week     Attends religious service: More than 4 times per year    Active member of club or organization: No    Attends meetings of clubs or organizations: Never    Relationship status: Married   Intimate partner violence:    Fear of current or ex partner: No    Emotionally abused: No    Physically abused: No    Forced sexual activity: No  Other Topics Concern   Not on file  Social History Narrative   Not on file    FAMILY HISTORY: Family History  Problem Relation Age of Onset   Hypertension Mother    Diabetes Mother    Varicose Veins Mother    Hypothyroidism Mother    Irritable bowel syndrome Mother    Stroke Mother    Heart disease Father    Heart attack Father    Hypertension Father    Asthma Father    COPD Father    Seizures Sister    Stroke Maternal Grandfather     ALLERGIES:  is allergic to topamax [topiramate]; aleve [naproxen sodium]; eggs or egg-derived products; mobic [meloxicam]; and keppra [levetiracetam].  MEDICATIONS:  Current Outpatient Medications  Medication Sig Dispense Refill   acetaminophen (TYLENOL) 500 MG tablet Take 1,000 mg by mouth every 6 (six) hours as needed for mild pain.     albuterol (PROVENTIL HFA;VENTOLIN HFA) 108 (90 BASE) MCG/ACT inhaler Inhale 2 puffs into the lungs every 4 (four) hours as needed. For wheezing     budesonide (PULMICORT FLEXHALER) 180 MCG/ACT inhaler Inhale 1 puff into the lungs 2 (two) times daily.     enoxaparin (LOVENOX) 40 MG/0.4ML injection Inject 40 mg into the skin daily.     oxyCODONE (OXY IR/ROXICODONE) 5 MG immediate release tablet Take 1 tablet (5 mg total) by mouth every 4 (four) hours as needed for severe pain. 20 tablet 0   Prenatal Vit-Fe Fumarate-FA (PRENATAL VITAMIN) 27-0.8 MG TABS Take 1 capsule by mouth daily. 30 tablet    No current facility-administered medications for this visit.     REVIEW OF SYSTEMS:    10 Point review of Systems was done is negative except as noted  above.  PHYSICAL EXAMINATION:  . Vitals:   07/04/18 1010  BP: (!) 140/99  Pulse: 82  Resp: 17  Temp: 98.2 F (36.8 C)  SpO2: 99%   Filed Weights   07/04/18 1010  Weight: 194 lb 12.8 oz (88.4 kg)   .Body mass index is 30.51 kg/m.  GENERAL:alert, in no acute distress and comfortable SKIN: no acute rashes, no significant lesions EYES: conjunctiva are pink and non-injected, sclera anicteric OROPHARYNX: MMM, no exudates, no oropharyngeal erythema or ulceration NECK: supple,  no JVD LYMPH:  no palpable lymphadenopathy in the cervical, axillary or inguinal regions LUNGS: clear to auscultation b/l with normal respiratory effort HEART: regular rate & rhythm ABDOMEN:  normoactive bowel sounds , non tender, not distended. Extremity: no pedal edema PSYCH: alert & oriented x 3 with fluent speech NEURO: no focal motor/sensory deficits  LABORATORY DATA:  I have reviewed the data as listed  . CBC Latest Ref Rng & Units 07/04/2018 01/01/2018 11/28/2017  WBC 4.0 - 10.5 K/uL 8.9 7.2 12.2(H)  Hemoglobin 12.0 - 15.0 g/dL 78.2 95.6 9.9(L)  Hematocrit 36.0 - 46.0 % 40.2 40.7 28.8(L)  Platelets 150 - 400 K/uL 252 267 186    . CMP Latest Ref Rng & Units 07/04/2018 11/27/2017 10/29/2017  Glucose 70 - 99 mg/dL 87 213(Y) 865(H)  BUN 6 - 20 mg/dL Creatinine 0.44 - 1.00 mg/dL 8.46 9.62(X) 5.28(U)  Sodium 135 - 145 mmol/L 138 135 139  Potassium 3.5 - 5.1 mmol/L 3.9 3.9 3.7  Chloride 98 - 111 mmol/L 106 105 103  CO2 22 - 32 mmol/L Calcium 8.9 - 10.3 mg/dL 1.3(K) 4.4(W) 8.7  Total Protein 6.5 - 8.1 g/dL 6.8 1.0(U) 7.2(Z)  Total Bilirubin 0.3 - 1.2 mg/dL 0.3 0.6 <3.6  Alkaline Phos 38 - 126 U/L 65 100 95  AST 15 - 41 U/L ALT 0 - 44 U/L Component     Latest Ref Rng & Units 07/04/2018  Iron     41 - 142 ug/dL 644  TIBC     034 - 742 ug/dL 595  Saturation Ratios     21 - 57 % 34  UIBC     120 - 384 ug/dL 638  Protein S, Total     60 - 150 % 77  Protein  S, Free     57 - 157 % 24 (L)  Protein S-Functional     63 - 140 % 65  Vitamin B12     180 - 914 pg/mL 317  Ferritin     11 - 307 ng/mL 12    [facility] test name PAI 1 POLYMORPHISM   Comment: Performed at [facility], [address], [place], [place] [address]  Misc [facility] result COMMENT   Comment: (NOTE)  Test Ordered: [phone] PAI-1 Gene Polymorphism  PAI-1 Locus 4G/5G Polymorphism Comment          [facility]   Patient DNA was evaluated for the PAI-1 4G/5G promoter  polymorphism, which is a single base pair guanine (4G/5G)  deletion/insertion polymorphism, using polymerase chain  reaction (PCR) technology and restriction fragment length  polymorphism (RFLP).  Results            4G/4G           UY   Homozygous for the 4G deletion allele.  Interpretation         Comment          UY   This individual has two copies of the 4G allele, also known  as the 4G/4G genotype of the plasminogen activator  inhibitor type 1 (PAI-1) gene. The 4G/4G genotype is  associated with the highest PAI-1 activity and antigen  levels compared to those individuals that have either the  5G/5G or 4G/5G genotype. The 4G/4G genotype is associated  with enhanced transcription and PAI levels about 25% higher  than those with a 5G/5G genotype. Elevated PAI-1 levels are  associated with an increased risk of coronary artery  disease, venous thromboembolic disease and possibly  complications of pregnancy such as recurrent abortion.  Comments            Comment          UY   Simultaneous Risks: If a patient possesses two or more  congenital or acquired risk factors, the risk of disease  may rise to more than the sum of the risk ratios for the  individual risk factors. For instance, a combination of the  4G/4G genotype and the insulin resistance syndrome may  confer an increase in cardiovascular disease risk over that   conferred by the presence of an isolated PAI-1 4G/4G  polymorphism.  Recommendations for Genetic Counseling: The PAI-1 4G allele  is an inherited characteristic. If the polymorphism is  present in a heterozygous or homozygous fashion, we  recommend that the patient and their family consider  genetic counseling to obtain additional information on  inheritance and to identify other family members at risk.  Testing Characteristics: Genetic testing by PCR provides  exceptionally high sensitivity and specificity. Incorrect  genotyping results can be caused by rare polymorphisms in  primer binding sites and to misidentification of specimens  by collectors or laboratory personnel. This assay analyzes  only the PAI 4G/5G locus and does not measure genetic  abnormalities elsewhere in the genome.  This test was developed and its performance characteristics  determined by [facility]. It has not been cleared or approved  by the Food and Drug Administration.      RADIOGRAPHIC STUDIES: I have personally reviewed the radiological images as listed and agreed with the findings in the report. No results found.  ASSESSMENT & PLAN:  38 y.o. female with  1. 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 Free Protein S levels were low at 21%, with repeat of 18%. History of two pregnancy losses in 2008 at 26 weeks and first trimester respectively. No history of blood clots Has had prophylactic anticoagulation during pregnancy with uncomplicated vaginal delivery on 11/27/17  2. Iron deficiency anemia  PLAN: -Discussed patient's most recent labs from 01/11/18, blood counts normal including WBC at 7.2k, HGB at 13.5, and PLT at 267k. Ferritin at 33. -Discussed that Protein S levels are known to be low during and around the time of pregnancy. Would like to recheck these today, as pt is 9 months removed from  most recent delivery. -Recommend 60mg  Lovenox in future pregnancies, until 6 weeks post delivery -Will need to be mindful of preventative anticoagulation peri and post-operatively for future surgeries -Discussed that if pt chose to take 81mg  aspirin this would not be unreasonable, but taking long term blood thinners is not strongly indicated -Recommend periodic ambulation and wearing compression socks on long distance travel. Recommend staying well hydrated and avoiding excessive caffeine and alcohol consumption on long distance travel -Avoid PO estrogen/progesterone containing birth control -Will look for indication of iron replacement after checking labs today  -Will order labs today Telephone visit in 2 weeks.   All of the patients questions were answered with apparent satisfaction. The patient knows to call the clinic with any problems, questions or concerns.  The total time spent in the appt was 45 minutes and more than 50% was on counseling and direct patient cares.    Wyvonnia LoraGautam Vikas Wegmann MD MS AAHIVMS [facility] Hematology/Oncology Physician [facility]  (  Office):       [phone] (Work cell):  [phone] (Fax):           [phone]  07/04/2018 10:51 AM  I, Marcelline Mates, am acting as a scribe for Dr. Wyvonnia Lora.   .I have reviewed the above documentation for accuracy and completeness, and I agree with the above. Johney Maine MD   ADDENDUM  Protein S free levels still low at 24 -- confirms functional deficiency.  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 - This could be an additional explanation for recurrent pregnancy losses , elevated risk of cardiovascular disease and VTE PLAN -would recommend daily baby ASA for VTE and CVD prophylaxis -will need lovenox prophylaxis during pregnancy and post partum in future.  Johney Maine MD

## 2018-07-04 ENCOUNTER — Inpatient Hospital Stay: Payer: BLUE CROSS/BLUE SHIELD

## 2018-07-04 ENCOUNTER — Other Ambulatory Visit: Payer: Self-pay

## 2018-07-04 ENCOUNTER — Inpatient Hospital Stay: Payer: BLUE CROSS/BLUE SHIELD | Attending: Hematology | Admitting: Hematology

## 2018-07-04 VITALS — BP 140/99 | HR 82 | Temp 98.2°F | Resp 17 | Ht 67.0 in | Wt 194.8 lb

## 2018-07-04 DIAGNOSIS — Z79899 Other long term (current) drug therapy: Secondary | ICD-10-CM | POA: Diagnosis not present

## 2018-07-04 DIAGNOSIS — R5383 Other fatigue: Secondary | ICD-10-CM

## 2018-07-04 DIAGNOSIS — D509 Iron deficiency anemia, unspecified: Secondary | ICD-10-CM | POA: Diagnosis not present

## 2018-07-04 DIAGNOSIS — Z7901 Long term (current) use of anticoagulants: Secondary | ICD-10-CM

## 2018-07-04 DIAGNOSIS — D6859 Other primary thrombophilia: Secondary | ICD-10-CM

## 2018-07-04 DIAGNOSIS — J45909 Unspecified asthma, uncomplicated: Secondary | ICD-10-CM

## 2018-07-04 DIAGNOSIS — Z87891 Personal history of nicotine dependence: Secondary | ICD-10-CM

## 2018-07-04 DIAGNOSIS — Z7951 Long term (current) use of inhaled steroids: Secondary | ICD-10-CM | POA: Diagnosis not present

## 2018-07-04 LAB — CBC WITH DIFFERENTIAL/PLATELET
Abs Immature Granulocytes: 0.02 10*3/uL (ref 0.00–0.07)
Basophils Absolute: 0 10*3/uL (ref 0.0–0.1)
Basophils Relative: 0 %
Eosinophils Absolute: 0.3 10*3/uL (ref 0.0–0.5)
Eosinophils Relative: 4 %
HCT: 40.2 % (ref 36.0–46.0)
Hemoglobin: 13.2 g/dL (ref 12.0–15.0)
Immature Granulocytes: 0 %
Lymphocytes Relative: 14 %
Lymphs Abs: 1.3 10*3/uL (ref 0.7–4.0)
MCH: 29.1 pg (ref 26.0–34.0)
MCHC: 32.8 g/dL (ref 30.0–36.0)
MCV: 88.5 fL (ref 80.0–100.0)
Monocytes Absolute: 0.5 10*3/uL (ref 0.1–1.0)
Monocytes Relative: 6 %
Neutro Abs: 6.8 10*3/uL (ref 1.7–7.7)
Neutrophils Relative %: 76 %
Platelets: 252 10*3/uL (ref 150–400)
RBC: 4.54 MIL/uL (ref 3.87–5.11)
RDW: 13.1 % (ref 11.5–15.5)
WBC: 8.9 10*3/uL (ref 4.0–10.5)
nRBC: 0 % (ref 0.0–0.2)

## 2018-07-04 LAB — CMP (CANCER CENTER ONLY)
ALT: 17 U/L (ref 0–44)
AST: 19 U/L (ref 15–41)
Albumin: 4.2 g/dL (ref 3.5–5.0)
Alkaline Phosphatase: 65 U/L (ref 38–126)
Anion gap: 9 (ref 5–15)
BUN: 9 mg/dL (ref 6–20)
CO2: 23 mmol/L (ref 22–32)
Calcium: 8.6 mg/dL — ABNORMAL LOW (ref 8.9–10.3)
Chloride: 106 mmol/L (ref 98–111)
Creatinine: 0.73 mg/dL (ref 0.44–1.00)
GFR, Est AFR Am: >60 mL/min (ref >60)
GFR, Estimated: >60 mL/min (ref >60)
Glucose, Bld: 87 mg/dL (ref 70–99)
Potassium: 3.9 mmol/L (ref 3.5–5.1)
Sodium: 138 mmol/L (ref 135–145)
Total Bilirubin: 0.3 mg/dL (ref 0.3–1.2)
Total Protein: 6.8 g/dL (ref 6.5–8.1)

## 2018-07-04 LAB — IRON AND TIBC
Iron: 114 ug/dL (ref 41–142)
Saturation Ratios: 34 % (ref 21–57)
TIBC: 336 ug/dL (ref 236–444)
UIBC: 222 ug/dL (ref 120–384)

## 2018-07-04 LAB — FERRITIN: Ferritin: 12 ng/mL (ref 11–307)

## 2018-07-04 LAB — VITAMIN B12: Vitamin B-12: 317 pg/mL (ref 180–914)

## 2018-07-04 LAB — TSH: TSH: 0.947 u[IU]/mL (ref 0.308–3.960)

## 2018-07-05 ENCOUNTER — Telehealth: Payer: Self-pay | Admitting: Hematology

## 2018-07-05 LAB — PROTEIN S PANEL
Protein S Activity: 65 % (ref 63–140)
Protein S Ag, Free: 24 % — ABNORMAL LOW (ref 57–157)
Protein S Ag, Total: 77 % (ref 60–150)

## 2018-07-05 NOTE — Telephone Encounter (Signed)
No los per 6/4. °

## 2018-07-09 LAB — MISC LABCORP TEST (SEND OUT): Labcorp test code: 500309

## 2018-12-03 ENCOUNTER — Other Ambulatory Visit: Payer: Self-pay

## 2018-12-03 ENCOUNTER — Emergency Department
Admission: EM | Admit: 2018-12-03 | Discharge: 2018-12-03 | Disposition: A | Discharge status: Home / Self Care | Payer: BC Managed Care – PPO | Source: Home / Self Care | Attending: Emergency Medicine | Admitting: Emergency Medicine

## 2018-12-03 ENCOUNTER — Encounter: Payer: Self-pay | Admitting: Emergency Medicine

## 2018-12-03 DIAGNOSIS — L0501 Pilonidal cyst with abscess: Secondary | ICD-10-CM

## 2018-12-03 MED ORDER — AMOXICILLIN-POT CLAVULANATE 875-125 MG PO TABS: 1.0000 | ORAL_TABLET | Freq: Two times a day (BID) | ORAL | 0 refills | Status: DC

## 2018-12-03 MED ORDER — TRAMADOL HCL 50 MG PO TABS: 50.0000 mg | ORAL_TABLET | Freq: Four times a day (QID) | ORAL | 0 refills | Status: AC | PRN

## 2018-12-03 NOTE — ED Provider Notes (Signed)
[facility]    CSN: [id number] Arrival date & time: 12/03/18  1130      History   Chief Complaint Chief Complaint  Patient presents with  . Abscess    HPI Kerry Duncan is a 38 y.o. female.  Patient is a 4 to 5-day history of pain and swelling between her buttocks.  She has had 5 previously treated pilonidal cyst.  Only one required I&D.  She knows she needs to have surgery but has been putting it off.  She has nausea with sulfa. HPI  Past Medical History:  Diagnosis Date  . Anemia, iron deficiency 04/24/2017  . Asthma   . High-risk pregnancy 04/24/2017   Protein S deficiency 2 prior miscarriages: 1st a stillborn age 43, 2nd early miss <6 wks age 38  . Protein C deficiency (HCC)   . Protein S deficiency (HCC)   . PTSD (post-traumatic stress disorder)   . Seizure [facility])     Patient Active Problem List   Diagnosis Date Noted  . SVD (spontaneous vaginal delivery) 11/28/2017  . Second-degree perineal laceration, with delivery 11/28/2017  . Postpartum care following vaginal delivery (10/29) 11/28/2017  . Gestational diabetes mellitus, class A1 11/28/2017  . Acute blood loss anemia 11/28/2017  . Encounter for planned induction of labor 11/27/2017  . High-risk pregnancy 04/24/2017  . Anemia, iron deficiency 04/24/2017  . Respiratory failure with hypoxia (HCC) 02/25/2012  . Influenza-like illness 02/25/2012  . Acute asthma exacerbation 02/25/2012  . Protein S deficiency [facility])     Past Surgical History:  Procedure Laterality Date  . DILATION AND CURETTAGE OF UTERUS    . MOUTH SURGERY      OB History    Gravida  3   Para  2   Term  1   Preterm  1   AB  1   Living  1     SAB  1   TAB      Ectopic      Multiple  0   Live Births  1            Home Medications    Prior to Admission medications   Medication Sig Start Date End Date Taking? Authorizing Provider  butalbital-acetaminophen-caffeine (FIORICET) 50-325-40 MG tablet Take  by mouth 2 (two) times daily as needed for headache.   Yes [provider]  EPINEPHrine (PRIMATENE MIST) 0.125 MG/ACT AERO Inhale into the lungs.   Yes [provider]  SUMAtriptan (IMITREX) 100 MG tablet Take 100 mg by mouth every 2 (two) hours as needed for migraine. May repeat in 2 hours if headache persists or recurs.   Yes [provider]  acetaminophen (TYLENOL) 500 MG tablet Take 1,000 mg by mouth every 6 (six) hours as needed for mild pain.    [provider]  albuterol (PROVENTIL HFA;VENTOLIN HFA) 108 (90 BASE) MCG/ACT inhaler Inhale 2 puffs into the lungs every 4 (four) hours as needed. For wheezing    [provider]  amoxicillin-clavulanate (AUGMENTIN) 875-125 MG tablet Take 1 tablet by mouth 2 (two) times daily. 12/03/18   Collene Gobble, MD  traMADol (ULTRAM) 50 MG tablet Take 1 tablet (50 mg total) by mouth every 6 (six) hours as needed for up to 3 days. 12/03/18 12/06/18  Collene Gobble, MD  promethazine (PHENERGAN) 12.5 MG tablet Take 2 tablets (25 mg total) by mouth every 6 (six) hours as needed for nausea or vomiting. 04/24/17 11/27/17  Levert Feinstein, MD  Family History Family History  Problem Relation Age of Onset  . Hypertension Mother   . Diabetes Mother   . Varicose Veins Mother   . Hypothyroidism Mother   . Irritable bowel syndrome Mother   . Stroke Mother   . Heart disease Father   . Heart attack Father   . Hypertension Father   . Asthma Father   . COPD Father   . Seizures Sister   . Stroke Maternal Grandfather     Social History Social History   Tobacco Use  . Smoking status: Current Every Day Smoker    Packs/day: 0.50    Types: Cigarettes  . Smokeless tobacco: Never Used  Substance Use Topics  . Alcohol use: No  . Drug use: No     Allergies   Topamax [topiramate], Aleve [naproxen sodium], Eggs or egg-derived products, Mobic [meloxicam], and Keppra [levetiracetam]   Review of Systems Review of  Systems  Constitutional: Negative.   Respiratory: Negative.   Gastrointestinal: Negative.        She has pain and swelling between her buttocks.     Physical Exam Triage Vital Signs ED Triage Vitals  Enc Vitals Group     BP 12/03/18 1148 118/83     Pulse Rate 12/03/18 1148 91     Resp --      Temp 12/03/18 1148 98.1 F (36.7 C)     Temp Source 12/03/18 1148 Oral     SpO2 12/03/18 1148 96 %     Weight 12/03/18 1150 170 lb (77.1 kg)     Height 12/03/18 1150 5\' 7"  (1.702 m)     Head Circumference --      Peak Flow --      Pain Score 12/03/18 1149 9     Pain Loc --      Pain Edu? --      Excl. in [place]? --    No data found.  Updated Vital Signs BP 118/83 (BP Location: Right Arm)   Pulse 91   Temp 98.1 F (36.7 C) (Oral)   Ht 5\' 7"  (1.702 m)   Wt 77.1 kg   SpO2 96%   BMI 26.63 kg/m   Visual Acuity Right Eye Distance:   Left Eye Distance:   Bilateral Distance:    Right Eye Near:   Left Eye Near:    Bilateral Near:     Physical Exam Constitutional:      Appearance: Normal appearance.  Abdominal:     General: Abdomen is flat.     Palpations: Abdomen is soft.  Skin:    Comments: There is a fluctuant/indurated mass just to the left of the midline in the gluteal crease.  There is a previously healed I&D site.  Neurological:     Mental Status: She is alert.    Procedure note: The area of fluctuance and induration was prepped with Betadine x3. 4 cc of 2% plain lidocaine was injected. A #11 blade was used and a 1.5 cm incision was made. Blunt hemostat was used and the incision site was opened with return of 2 cc of purulent bloody material.  2 inches of packing was placed into the I&D site.  Discharge sent for culture.  Patient tolerated procedure well.  UC Treatments / Results  Labs (all labs ordered are listed, but only abnormal results are displayed) Labs Reviewed  WOUND CULTURE    EKG   Radiology No results found.  Procedures Procedures (including  critical care time)  Medications  Ordered in UC Medications - No data to display  Initial Impression / Assessment and Plan / UC Course  I have reviewed the triage vital signs and the nursing notes. Patient seen with recurrent pilonidal cyst.  She knows she needs to have surgery but has not had the time.  Will cover with Augmentin 875 twice daily pending culture results.  He is instructed to return to clinic in 48 hours for packing change.  I did pull up the PMP review but was unable to place the security number when asked by the computer.  She did receive a few hydrocodone 2 months ago but has not been a regular controlled substance user.  I did give her #12 Ultram to have for pain. Pertinent labs & imaging results that were available during my care of the patient were reviewed by me and considered in my medical decision making (see chart for details).      Final Clinical Impressions(s) / UC Diagnoses   Final diagnoses:  Second-degree perineal laceration, with delivery  Pilonidal abscess     Discharge Instructions     Keep area clean and dry. Return to clinic in 48 hours for wound recheck. We will call you with culture results.    ED Prescriptions    Medication Sig Dispense Auth. Provider   amoxicillin-clavulanate (AUGMENTIN) 875-125 MG tablet Take 1 tablet by mouth 2 (two) times daily. 20 tablet Collene Gobbleaub, Deshannon Seide A, MD   traMADol (ULTRAM) 50 MG tablet Take 1 tablet (50 mg total) by mouth every 6 (six) hours as needed for up to 3 days. 12 tablet Collene Gobbleaub, Rhyann Berton A, MD     I have reviewed the PDMP during this encounter.   Collene Gobbleaub, Dwain Huhn A, MD 12/03/18 1430

## 2018-12-03 NOTE — ED Triage Notes (Signed)
Abscess x 6 days, this is the 5th episode

## 2018-12-03 NOTE — Discharge Instructions (Signed)
Keep area clean and dry. Return to clinic in 48 hours for wound recheck. We will call you with culture results.

## 2018-12-06 LAB — WOUND CULTURE
MICRO NUMBER:: 1059182
SPECIMEN QUALITY:: ADEQUATE

## 2019-01-02 ENCOUNTER — Encounter: Payer: Self-pay | Admitting: Emergency Medicine

## 2019-01-02 ENCOUNTER — Other Ambulatory Visit: Payer: Self-pay

## 2019-01-02 ENCOUNTER — Emergency Department (INDEPENDENT_AMBULATORY_CARE_PROVIDER_SITE_OTHER)
Admission: EM | Admit: 2019-01-02 | Discharge: 2019-01-02 | Disposition: A | Discharge status: Home / Self Care | Payer: BC Managed Care – PPO | Source: Home / Self Care

## 2019-01-02 DIAGNOSIS — L0591 Pilonidal cyst without abscess: Secondary | ICD-10-CM

## 2019-01-02 MED ORDER — HYDROCODONE-ACETAMINOPHEN 5-325 MG PO TABS: 1.0000 | ORAL_TABLET | ORAL | 0 refills | Status: DC | PRN

## 2019-01-02 MED ORDER — DOXYCYCLINE HYCLATE 100 MG PO CAPS: 100.0000 mg | ORAL_CAPSULE | Freq: Two times a day (BID) | ORAL | 0 refills | Status: DC

## 2019-01-02 NOTE — ED Triage Notes (Signed)
Pt states she had a pilonidal cyst drained in august then again about 1 month ago and now cyst is back in same spot. Noticed it about 1 week ago.

## 2019-01-03 DIAGNOSIS — L0591 Pilonidal cyst without abscess: Secondary | ICD-10-CM | POA: Diagnosis not present

## 2019-01-03 NOTE — ED Provider Notes (Signed)
[facility]    CSN: [id number] Arrival date & time: 01/02/19  1029      History   Chief Complaint Chief Complaint  Patient presents with  . Cyst    HPI Kerry CAMPOSANO is a 38 y.o. female.   The history is provided by the patient. No language interpreter was used.  Abscess Location:  Torso Torso abscess location:  Lower back Size:  2 Abscess quality: painful, redness and warmth   Red streaking: no   Progression:  Worsening Pain details:    Quality:  No pain   Severity:  Moderate   Timing:  Constant   Progression:  Worsening Chronicity:  Recurrent Relieved by:  Nothing Worsened by:  Nothing Ineffective treatments:  Warm water soaks and warm compresses  Pt reports multiple pilonidal abscesses.  Pt reports she plans to see a surgeon  Past Medical History:  Diagnosis Date  . Anemia, iron deficiency 04/24/2017  . Asthma   . High-risk pregnancy 04/24/2017   Protein S deficiency 2 prior miscarriages: 1st a stillborn age 3, 2nd early miss <6 wks age 25  . Protein C deficiency (HCC)   . Protein S deficiency (HCC)   . PTSD (post-traumatic stress disorder)   . Seizure [facility])     Patient Active Problem List   Diagnosis Date Noted  . SVD (spontaneous vaginal delivery) 11/28/2017  . Second-degree perineal laceration, with delivery 11/28/2017  . Postpartum care following vaginal delivery (10/29) 11/28/2017  . Gestational diabetes mellitus, class A1 11/28/2017  . Acute blood loss anemia 11/28/2017  . Encounter for planned induction of labor 11/27/2017  . High-risk pregnancy 04/24/2017  . Anemia, iron deficiency 04/24/2017  . Respiratory failure with hypoxia (HCC) 02/25/2012  . Influenza-like illness 02/25/2012  . Acute asthma exacerbation 02/25/2012  . Protein S deficiency [facility]     Past Surgical History:  Procedure Laterality Date  . DILATION AND CURETTAGE OF UTERUS    . MOUTH SURGERY      OB History    Gravida  3   Para  2   Term  1   Preterm  1   AB  1   Living  1     SAB  1   TAB      Ectopic      Multiple  0   Live Births  1            Home Medications    Prior to Admission medications   Medication Sig Start Date End Date Taking? Authorizing Provider  acetaminophen (TYLENOL) 500 MG tablet Take 1,000 mg by mouth every 6 (six) hours as needed for mild pain.    [provider]  albuterol (PROVENTIL HFA;VENTOLIN HFA) 108 (90 BASE) MCG/ACT inhaler Inhale 2 puffs into the lungs every 4 (four) hours as needed. For wheezing    [provider]  amoxicillin-clavulanate (AUGMENTIN) 875-125 MG tablet Take 1 tablet by mouth 2 (two) times daily. 12/03/18   Collene Gobble, MD  butalbital-acetaminophen-caffeine (FIORICET) [phone] MG tablet Take by mouth 2 (two) times daily as needed for headache.    [provider]  doxycycline (VIBRAMYCIN) 100 MG capsule Take 1 capsule (100 mg total) by mouth 2 (two) times daily. 01/02/19   Elson Areas, PA-C  EPINEPHrine (PRIMATENE MIST) 0.125 MG/ACT AERO Inhale into the lungs.    [provider]  HYDROcodone-acetaminophen (NORCO/VICODIN) 5-325 MG tablet Take 1 tablet by mouth every 4 (four) hours as needed for moderate pain. 01/02/19  01/02/20  Fransico Meadow, PA-C  SUMAtriptan (IMITREX) 100 MG tablet Take 100 mg by mouth every 2 (two) hours as needed for migraine. May repeat in 2 hours if headache persists or recurs.    [provider]  promethazine (PHENERGAN) 12.5 MG tablet Take 2 tablets (25 mg total) by mouth every 6 (six) hours as needed for nausea or vomiting. 04/24/17 11/27/17  Annia Belt, MD    Family History Family History  Problem Relation Age of Onset  . Hypertension Mother   . Diabetes Mother   . Varicose Veins Mother   . Hypothyroidism Mother   . Irritable bowel syndrome Mother   . Stroke Mother   . Heart disease Father   . Heart attack Father   . Hypertension Father   . Asthma Father   . COPD Father   .  Seizures Sister   . Stroke Maternal Grandfather     Social History Social History   Tobacco Use  . Smoking status: Current Every Day Smoker    Packs/day: 0.50    Types: Cigarettes  . Smokeless tobacco: Never Used  Substance Use Topics  . Alcohol use: No  . Drug use: No     Allergies   Topamax [topiramate], Aleve [naproxen sodium], Eggs or egg-derived products, Mobic [meloxicam], and Keppra [levetiracetam]   Review of Systems Review of Systems  All other systems reviewed and are negative.    Physical Exam Triage Vital Signs ED Triage Vitals  Enc Vitals Group     BP 01/02/19 1057 133/83     Pulse Rate 01/02/19 1057 80     Resp --      Temp 01/02/19 1057 98.2 F (36.8 C)     Temp Source 01/02/19 1057 Oral     SpO2 01/02/19 1057 98 %     Weight 01/02/19 1052 172 lb (78 kg)     Height --      Head Circumference --      Peak Flow --      Pain Score 01/02/19 1054 7     Pain Loc --      Pain Edu? --      Excl. in [place]? --    No data found.  Updated Vital Signs BP 133/83 (BP Location: Right Arm)   Pulse 80   Temp 98.2 F (36.8 C) (Oral)   Wt 78 kg   SpO2 98%   BMI 26.94 kg/m   Visual Acuity Right Eye Distance:   Left Eye Distance:   Bilateral Distance:    Right Eye Near:   Left Eye Near:    Bilateral Near:     Physical Exam Vitals signs and nursing note reviewed.  Constitutional:      Appearance: She is well-developed.  HENT:     Head: Normocephalic.  Neck:     Musculoskeletal: Normal range of motion.  Cardiovascular:     Rate and Rhythm: Normal rate.  Pulmonary:     Effort: Pulmonary effort is normal.  Abdominal:     General: There is no distension.  Musculoskeletal: Normal range of motion.  Skin:    Comments: 5cm pilonidal area   Neurological:     Mental Status: She is alert and oriented to person, place, and time.  Psychiatric:        Mood and Affect: Mood normal.      UC Treatments / Results  Labs (all labs ordered are listed,  but only abnormal results are displayed) Labs Reviewed -  No data to display  EKG   Radiology No results found.  Procedures Incision and Drainage  Date/Time: 01/03/2019 7:31 PM Performed by: Elson AreasSofia, Leslie K, PA-C Authorized by: Elson AreasSofia, Leslie K, PA-C   Consent:    Consent obtained:  Verbal   Consent given by:  Patient   Risks discussed:  Incomplete drainage and bleeding   Alternatives discussed:  Delayed treatment Location:    Type:  Pilonidal cyst   Size:  5   Location:  Anogenital   Anogenital location:  Pilonidal Pre-procedure details:    Skin preparation:  Betadine Anesthesia (see MAR for exact dosages):    Anesthesia method:  Local infiltration   Local anesthetic:  Lidocaine 2% w/o epi Procedure type:    Complexity:  Simple Procedure details:    Needle aspiration: no     Incision types:  Single straight   Scalpel blade:  11   Drainage:  Purulent   Wound treatment:  Wound left open Post-procedure details:    Patient tolerance of procedure:  Tolerated well, no immediate complications   (including critical care time)  Medications Ordered in UC Medications - No data to display  Initial Impression / Assessment and Plan / UC Course  I have reviewed the triage vital signs and the nursing notes.  Pertinent labs & imaging results that were available during my care of the patient were reviewed by me and considered in my medical decision making (see chart for details).      Final Clinical Impressions(s) / UC Diagnoses   Final diagnoses:  Pilonidal cyst   Discharge Instructions   None    ED Prescriptions    Medication Sig Dispense Auth. Provider   doxycycline (VIBRAMYCIN) 100 MG capsule Take 1 capsule (100 mg total) by mouth 2 (two) times daily. 20 capsule Sofia, Leslie K, [place]-C   HYDROcodone-acetaminophen (NORCO/VICODIN) 5-325 MG tablet Take 1 tablet by mouth every 4 (four) hours as needed for moderate pain. 10 tablet Elson AreasSofia, Leslie K, [place]-C     I have  reviewed the PDMP during this encounter.  An After Visit Summary was printed and given to the patient.    Elson AreasSofia, Leslie K, [place]-C 01/03/19 1933

## 2019-01-31 NOTE — L&D Delivery Note (Signed)
Delivery Note At 1727 a viable female was delivered over intact perineum via svd (Presentation: cephalic ;  LOA).  APGAR:9 ,9 ; weight  not yet done.   Placenta status: delivered,spontaneously, intact .  Cord:3vv,  with the following complications: none.  Anesthesia:  epidural Episiotomy:  none Lacerations:  2nd degree, small bilat vaginal, bilat labial Suture Repair: 3.0 vicryl Est. Blood Loss (mL):   Mom to postpartum.  Baby to Couplet care / Skin to Skin.  Vick Frees 12/27/2019, 5:57 PM

## 2019-02-18 ENCOUNTER — Ambulatory Visit: Payer: Self-pay | Admitting: General Surgery

## 2019-02-19 ENCOUNTER — Encounter: Payer: Self-pay | Admitting: Emergency Medicine

## 2019-02-19 ENCOUNTER — Other Ambulatory Visit: Payer: Self-pay

## 2019-02-19 ENCOUNTER — Emergency Department (INDEPENDENT_AMBULATORY_CARE_PROVIDER_SITE_OTHER)
Admission: EM | Admit: 2019-02-19 | Discharge: 2019-02-19 | Disposition: A | Discharge status: Home / Self Care | Payer: 59 | Source: Home / Self Care | Attending: Family Medicine | Admitting: Family Medicine

## 2019-02-19 DIAGNOSIS — L0591 Pilonidal cyst without abscess: Secondary | ICD-10-CM | POA: Diagnosis not present

## 2019-02-19 MED ORDER — HYDROCODONE-ACETAMINOPHEN 5-325 MG PO TABS: ORAL_TABLET | ORAL | 0 refills | Status: DC

## 2019-02-19 NOTE — Discharge Instructions (Addendum)
Leave bandage in place until followup visit tomorrow.  Keep bandage clean and dry.  May take Ibuprofen 200mg , 4 tabs every 8 hours with food.  Continue doxycycline.

## 2019-02-19 NOTE — ED Triage Notes (Signed)
Pilonidal cyst x 6 days

## 2019-02-19 NOTE — ED Provider Notes (Signed)
[facility]    CSN: [id number] Arrival date & time: 02/19/19  1556      History   Chief Complaint Chief Complaint  Patient presents with  . Cyst    HPI Kerry Duncan is a 39 y.o. female.   Patient complains of 5 day history of painful recurrent pilonidal cyst.  She is scheduled for surgical resection of the cyst on February 19.  She denies fevers, chills, and sweats.  She is presently taking doxycycline that was previously prescribed.  The history is provided by the patient.  Abscess Abscess location: intergluteal cleft. Size:  2cm by 3cm Abscess quality: fluctuance, painful and redness   Abscess quality: not draining (3cm diameter) and not weeping   Duration:  5 days Progression:  Worsening Pain details:    Quality:  Pressure   Severity:  Moderate   Duration:  5 days   Timing:  Constant   Progression:  Worsening Chronicity:  Recurrent Relieved by:  Nothing Worsened by:  Nothing Ineffective treatments:  Warm water soaks Associated symptoms: no fatigue, no fever, no nausea and no vomiting   Risk factors: prior abscess     Past Medical History:  Diagnosis Date  . Anemia, iron deficiency 04/24/2017  . Asthma   . High-risk pregnancy 04/24/2017   Protein S deficiency 2 prior miscarriages: 1st a stillborn age 18, 2nd early miss <6 wks age 7  . Protein C deficiency (HCC)   . Protein S deficiency (HCC)   . PTSD (post-traumatic stress disorder)   . Seizure [facility])     Patient Active Problem List   Diagnosis Date Noted  . SVD (spontaneous vaginal delivery) 11/28/2017  . Second-degree perineal laceration, with delivery 11/28/2017  . Postpartum care following vaginal delivery (10/29) 11/28/2017  . Gestational diabetes mellitus, class A1 11/28/2017  . Acute blood loss anemia 11/28/2017  . Encounter for planned induction of labor 11/27/2017  . High-risk pregnancy 04/24/2017  . Anemia, iron deficiency 04/24/2017  . Respiratory failure with hypoxia (HCC)  02/25/2012  . Influenza-like illness 02/25/2012  . Acute asthma exacerbation 02/25/2012  . Protein S deficiency [facility])     Past Surgical History:  Procedure Laterality Date  . DILATION AND CURETTAGE OF UTERUS    . MOUTH SURGERY      OB History    Gravida  3   Para  2   Term  1   Preterm  1   AB  1   Living  1     SAB  1   TAB      Ectopic      Multiple  0   Live Births  1            Home Medications    Prior to Admission medications   Medication Sig Start Date End Date Taking? Authorizing Provider  acetaminophen (TYLENOL) 500 MG tablet Take 1,000 mg by mouth every 6 (six) hours as needed for mild pain.    [provider]  albuterol (PROVENTIL HFA;VENTOLIN HFA) 108 (90 BASE) MCG/ACT inhaler Inhale 2 puffs into the lungs every 4 (four) hours as needed. For wheezing    [provider]  butalbital-acetaminophen-caffeine (FIORICET) 50-325-40 MG tablet Take by mouth 2 (two) times daily as needed for headache.    [provider]  EPINEPHrine (PRIMATENE MIST) 0.125 MG/ACT AERO Inhale into the lungs.    [provider]  HYDROcodone-acetaminophen (NORCO/VICODIN) 5-325 MG tablet Take one by mouth at bedtime as needed for pain  02/19/19   Lattie Haw, MD  promethazine (PHENERGAN) 12.5 MG tablet Take 2 tablets (25 mg total) by mouth every 6 (six) hours as needed for nausea or vomiting. 04/24/17 11/27/17  Levert Feinstein, MD    Family History Family History  Problem Relation Age of Onset  . Hypertension Mother   . Diabetes Mother   . Varicose Veins Mother   . Hypothyroidism Mother   . Irritable bowel syndrome Mother   . Stroke Mother   . Heart disease Father   . Heart attack Father   . Hypertension Father   . Asthma Father   . COPD Father   . Seizures Sister   . Stroke Maternal Grandfather     Social History Social History   Tobacco Use  . Smoking status: Current Every Day Smoker    Packs/day: 0.50    Types:  Cigarettes  . Smokeless tobacco: Never Used  Substance Use Topics  . Alcohol use: No  . Drug use: No     Allergies   Topamax [topiramate], Aleve [naproxen sodium], Eggs or egg-derived products, Mobic [meloxicam], and Keppra [levetiracetam]   Review of Systems Review of Systems  Constitutional: Positive for activity change. Negative for chills, diaphoresis, fatigue and fever.  Gastrointestinal: Negative for nausea and vomiting.  Skin:       Painful pilonidal cyst     Physical Exam Triage Vital Signs ED Triage Vitals  Enc Vitals Group     BP 02/19/19 1717 105/71     Pulse Rate 02/19/19 1717 83     Resp --      Temp 02/19/19 1717 98.5 F (36.9 C)     Temp Source 02/19/19 1717 Oral     SpO2 02/19/19 1717 99 %     Weight 02/19/19 1720 168 lb (76.2 kg)     Height 02/19/19 1720 5\' 7"  (1.702 m)     Head Circumference --      Peak Flow --      Pain Score 02/19/19 1718 10     Pain Loc --      Pain Edu? --      Excl. in [place]? --    No data found.  Updated Vital Signs BP 105/71 (BP Location: Right Arm)   Pulse 83   Temp 98.5 F (36.9 C) (Oral)   Ht 5\' 7"  (1.702 m)   Wt 76.2 kg   SpO2 99%   BMI 26.31 kg/m   Visual Acuity Right Eye Distance:   Left Eye Distance:   Bilateral Distance:    Right Eye Near:   Left Eye Near:    Bilateral Near:     Physical Exam Vitals and nursing note reviewed.  Constitutional:      General: She is not in acute distress.    Appearance: She is not ill-appearing.  HENT:     Head: Normocephalic.  Eyes:     Pupils: Pupils are equal, round, and reactive to light.  Cardiovascular:     Rate and Rhythm: Normal rate.  Pulmonary:     Effort: Pulmonary effort is normal.  Musculoskeletal:     Cervical back: Normal range of motion.  Skin:    General: Skin is warm and dry.          Comments: Superior aspect of intergluteal cleft has a 2cm by 3cm fluctuant pilonidal cyst.  Neurological:     Mental Status: She is alert.      UC  Treatments / Results  Labs (  all labs ordered are listed, but only abnormal results are displayed) Labs Reviewed  WOUND CULTURE    EKG   Radiology No results found.  Procedures Procedures  Incise and drain cyst/abscess Risks and benefits of procedure explained to patient and verbal consent obtained.  Using sterile technique, applied topical refrigerant spray followed by local anesthesia with 1% lidocaine with epinephrine, and cleansed affected area with Betadine and saline. Identified the most fluctuant area of lesion and incised with #11 blade.  Expressed blood and purulent material.  Inserted Iodoform gauze packing.  Bandage applied.  Patient tolerated well.  Medications Ordered in UC Medications - No data to display  Initial Impression / Assessment and Plan / UC Course  I have reviewed the triage vital signs and the nursing notes.  Pertinent labs & imaging results that were available during my care of the patient were reviewed by me and considered in my medical decision making (see chart for details).    Wound culture pending. Rx for Lortab at bedtime (#5, no refill) Return tomorrow for follow-up.   Final Clinical Impressions(s) / UC Diagnoses   Final diagnoses:  Infected pilonidal cyst     Discharge Instructions     Leave bandage in place until followup visit tomorrow.  Keep bandage clean and dry.  May take Ibuprofen 200mg , 4 tabs every 8 hours with food.  Continue doxycycline.    ED Prescriptions    Medication Sig Dispense Auth. Provider   HYDROcodone-acetaminophen (NORCO/VICODIN) 5-325 MG tablet Take one by mouth at bedtime as needed for pain 5 tablet Deniece Rankin, Ishmael Holter, MD     I have reviewed the PDMP during this encounter.   Kandra Nicolas, MD 02/21/19 2146

## 2019-02-20 ENCOUNTER — Emergency Department (INDEPENDENT_AMBULATORY_CARE_PROVIDER_SITE_OTHER)
Admission: EM | Admit: 2019-02-20 | Discharge: 2019-02-20 | Disposition: A | Discharge status: Home / Self Care | Payer: 59 | Source: Home / Self Care | Attending: Family Medicine | Admitting: Family Medicine

## 2019-02-20 DIAGNOSIS — Z4800 Encounter for change or removal of nonsurgical wound dressing: Secondary | ICD-10-CM

## 2019-02-20 NOTE — Discharge Instructions (Addendum)
Remove bandage and packing tomorrow, then change bandage daily until healed.  May begin warm soaks tomorrow.  Finish antibiotics.

## 2019-02-20 NOTE — ED Provider Notes (Signed)
[facility]    CSN: [id number] Arrival date & time: 02/20/19  1834      History   Chief Complaint Chief Complaint  Patient presents with  . Follow-up    HPI Kerry Duncan is a 39 y.o. female.   Patient returns for follow-up of pilonidal abscess I and D performed yesterday.  She has no complaints and reports that her pain is now minimal.  The history is provided by the patient.    Past Medical History:  Diagnosis Date  . Anemia, iron deficiency 04/24/2017  . Asthma   . High-risk pregnancy 04/24/2017   Protein S deficiency 2 prior miscarriages: 1st a stillborn age 73, 53nd early miss <6 wks age 78  . Protein C deficiency ([place])   . Protein S deficiency ([place])   . PTSD (post-traumatic stress disorder)   . Seizure [facility])     Patient Active Problem List   Diagnosis Date Noted  . SVD (spontaneous vaginal delivery) 11/28/2017  . Second-degree perineal laceration, with delivery 11/28/2017  . Postpartum care following vaginal delivery (10/29) 11/28/2017  . Gestational diabetes mellitus, class A1 11/28/2017  . Acute blood loss anemia 11/28/2017  . Encounter for planned induction of labor 11/27/2017  . High-risk pregnancy 04/24/2017  . Anemia, iron deficiency 04/24/2017  . Respiratory failure with hypoxia ([place]) 02/25/2012  . Influenza-like illness 02/25/2012  . Acute asthma exacerbation 02/25/2012  . Protein S deficiency [facility])     Past Surgical History:  Procedure Laterality Date  . DILATION AND CURETTAGE OF UTERUS    . MOUTH SURGERY      OB History    Gravida  3   Para  2   Term  1   Preterm  1   AB  1   Living  1     SAB  1   TAB      Ectopic      Multiple  0   Live Births  1            Home Medications    Prior to Admission medications   Medication Sig Start Date End Date Taking? Authorizing Provider  acetaminophen (TYLENOL) 500 MG tablet Take 1,000 mg by mouth every 6 (six) hours as needed for mild pain.    [provider]  albuterol (PROVENTIL HFA;VENTOLIN HFA) 108 (90 BASE) MCG/ACT inhaler Inhale 2 puffs into the lungs every 4 (four) hours as needed. For wheezing    [provider]  butalbital-acetaminophen-caffeine (FIORICET) 50-325-40 MG tablet Take by mouth 2 (two) times daily as needed for headache.    [provider]  EPINEPHrine (PRIMATENE MIST) 0.125 MG/ACT AERO Inhale into the lungs.    [provider]  HYDROcodone-acetaminophen (NORCO/VICODIN) 5-325 MG tablet Take one by mouth at bedtime as needed for pain 02/19/19   Kandra Nicolas, MD  promethazine (PHENERGAN) 12.5 MG tablet Take 2 tablets (25 mg total) by mouth every 6 (six) hours as needed for nausea or vomiting. 04/24/17 11/27/17  Annia Belt, MD    Family History Family History  Problem Relation Age of Onset  . Hypertension Mother   . Diabetes Mother   . Varicose Veins Mother   . Hypothyroidism Mother   . Irritable bowel syndrome Mother   . Stroke Mother   . Heart disease Father   . Heart attack Father   . Hypertension Father   . Asthma Father   . COPD Father   . Seizures Sister   .  Stroke Maternal Grandfather     Social History Social History   Tobacco Use  . Smoking status: Current Every Day Smoker    Packs/day: 0.50    Types: Cigarettes  . Smokeless tobacco: Never Used  Substance Use Topics  . Alcohol use: No  . Drug use: No     Allergies   Topamax [topiramate], Aleve [naproxen sodium], Eggs or egg-derived products, Mobic [meloxicam], and Keppra [levetiracetam]   Review of Systems Review of Systems  Constitutional: Negative for chills, diaphoresis, fatigue and fever.  All other systems reviewed and are negative.    Physical Exam Triage Vital Signs ED Triage Vitals  Enc Vitals Group     BP 02/20/19 1847 128/85     Pulse Rate 02/20/19 1847 77     Resp 02/20/19 1847 20     Temp 02/20/19 1847 98.2 F (36.8 C)     Temp Source 02/20/19 1847 Oral     SpO2  02/20/19 1847 98 %     Weight 02/20/19 1849 168 lb (76.2 kg)     Height 02/20/19 1849 5\' 7"  (1.702 m)     Head Circumference --      Peak Flow --      Pain Score 02/20/19 1849 0     Pain Loc --      Pain Edu? --      Excl. in [place]? --    No data found.  Updated Vital Signs BP 128/85 (BP Location: Right Arm)   Pulse 77   Temp 98.2 F (36.8 C) (Oral)   Resp 20   Ht 5\' 7"  (1.702 m)   Wt 76.2 kg   SpO2 98%   BMI 26.31 kg/m   Visual Acuity Right Eye Distance:   Left Eye Distance:   Bilateral Distance:    Right Eye Near:   Left Eye Near:    Bilateral Near:     Physical Exam Vitals and nursing note reviewed.  Constitutional:      General: She is not in acute distress. Eyes:     Pupils: Pupils are equal, round, and reactive to light.  Cardiovascular:     Rate and Rhythm: Normal rate.  Pulmonary:     Effort: Pulmonary effort is normal.  Skin:    Comments: Pilonidal cyst site has minimal surrounding erythema and tenderness to palpation.  Packing removed; abscess cavity now minimal.  Inserted a small length (approximately 2cm long) to maintain patency of wound.  Bandage applied.  Neurological:     Mental Status: She is alert.      UC Treatments / Results  Labs (all labs ordered are listed, but only abnormal results are displayed) Labs Reviewed - No data to display  EKG   Radiology No results found.  Procedures Procedures (including critical care time)  Medications Ordered in UC Medications - No data to display  Initial Impression / Assessment and Plan / UC Course  I have reviewed the triage vital signs and the nursing notes.  Pertinent labs & imaging results that were available during my care of the patient were reviewed by me and considered in my medical decision making (see chart for details).    Wound appears to be healing well.  Inserted small length (about 2cm) iodoform gauze into wound cavity in order to maintain wound patency.  Bandage  applied.  Final Clinical Impressions(s) / UC Diagnoses   Final diagnoses:  Dressing change     Discharge Instructions     Remove bandage  and packing tomorrow, then change bandage daily until healed.  May begin warm soaks tomorrow.  Finish antibiotics.    ED Prescriptions    None        Lattie Haw, MD 02/21/19 2154

## 2019-02-20 NOTE — ED Triage Notes (Signed)
Pt is here to have a wound check and packing removed.  Not experiencing pain.  Taking medication as prescribed

## 2019-02-22 LAB — WOUND CULTURE
MICRO NUMBER:: 10062346
SPECIMEN QUALITY:: ADEQUATE

## 2019-03-13 ENCOUNTER — Encounter (HOSPITAL_BASED_OUTPATIENT_CLINIC_OR_DEPARTMENT_OTHER): Payer: Self-pay | Admitting: General Surgery

## 2019-03-13 ENCOUNTER — Other Ambulatory Visit: Payer: Self-pay

## 2019-03-18 ENCOUNTER — Other Ambulatory Visit (HOSPITAL_COMMUNITY): Payer: BC Managed Care – PPO

## 2019-03-19 ENCOUNTER — Other Ambulatory Visit (HOSPITAL_COMMUNITY)
Admission: RE | Admit: 2019-03-19 | Discharge: 2019-03-19 | Disposition: A | Discharge status: Home / Self Care | Payer: 59 | Source: Ambulatory Visit | Attending: General Surgery | Admitting: General Surgery

## 2019-03-19 DIAGNOSIS — Z01812 Encounter for preprocedural laboratory examination: Secondary | ICD-10-CM | POA: Insufficient documentation

## 2019-03-19 DIAGNOSIS — Z20822 Contact with and (suspected) exposure to covid-19: Secondary | ICD-10-CM | POA: Diagnosis not present

## 2019-03-19 LAB — SARS CORONAVIRUS 2 (TAT 6-24 HRS): SARS Coronavirus 2: NEGATIVE

## 2019-03-19 NOTE — Progress Notes (Signed)

## 2019-03-20 NOTE — Progress Notes (Signed)
Patient contacted about new start time and arrival time. Patient stated she still planned to be here for surgery.

## 2019-03-21 ENCOUNTER — Ambulatory Visit (HOSPITAL_BASED_OUTPATIENT_CLINIC_OR_DEPARTMENT_OTHER)
Admission: RE | Admit: 2019-03-21 | Discharge: 2019-03-21 | Disposition: A | Discharge status: Home / Self Care | Payer: 59 | Attending: General Surgery | Admitting: General Surgery

## 2019-03-21 ENCOUNTER — Ambulatory Visit (HOSPITAL_BASED_OUTPATIENT_CLINIC_OR_DEPARTMENT_OTHER): Payer: 59 | Admitting: Anesthesiology

## 2019-03-21 ENCOUNTER — Encounter (HOSPITAL_BASED_OUTPATIENT_CLINIC_OR_DEPARTMENT_OTHER): Payer: Self-pay | Admitting: General Surgery

## 2019-03-21 ENCOUNTER — Other Ambulatory Visit: Payer: Self-pay

## 2019-03-21 ENCOUNTER — Encounter (HOSPITAL_BASED_OUTPATIENT_CLINIC_OR_DEPARTMENT_OTHER)
Admission: RE | Disposition: A | Discharge status: Home / Self Care | Payer: Self-pay | Source: Home / Self Care | Attending: General Surgery

## 2019-03-21 DIAGNOSIS — L0501 Pilonidal cyst with abscess: Secondary | ICD-10-CM | POA: Diagnosis present

## 2019-03-21 DIAGNOSIS — F17200 Nicotine dependence, unspecified, uncomplicated: Secondary | ICD-10-CM | POA: Insufficient documentation

## 2019-03-21 HISTORY — PX: APPLICATION OF A-CELL OF CHEST/ABDOMEN: SHX6302

## 2019-03-21 HISTORY — PX: INCISION AND DRAINAGE ABSCESS: SHX5864

## 2019-03-21 LAB — POCT PREGNANCY, URINE: Preg Test, Ur: NEGATIVE

## 2019-03-21 SURGERY — INCISION AND DRAINAGE, ABSCESS
Anesthesia: General | Site: Back

## 2019-03-21 MED ORDER — ONDANSETRON HCL 4 MG/2ML IJ SOLN
INTRAMUSCULAR | Status: AC
  Filled 2019-03-21: qty 2

## 2019-03-21 MED ORDER — PROPOFOL 10 MG/ML IV BOLUS
INTRAVENOUS | Status: AC
  Filled 2019-03-21: qty 20

## 2019-03-21 MED ORDER — ROCURONIUM BROMIDE 10 MG/ML (PF) SYRINGE
PREFILLED_SYRINGE | INTRAVENOUS | Status: AC
  Filled 2019-03-21: qty 10

## 2019-03-21 MED ORDER — HYDROMORPHONE HCL 1 MG/ML IJ SOLN
0.2500 mg | INTRAMUSCULAR | Status: DC | PRN
  Administered 2019-03-21: 0.5 mg via INTRAVENOUS

## 2019-03-21 MED ORDER — MEPERIDINE HCL 25 MG/ML IJ SOLN: 6.2500 mg | INTRAMUSCULAR | Status: DC | PRN

## 2019-03-21 MED ORDER — FENTANYL CITRATE (PF) 100 MCG/2ML IJ SOLN
INTRAMUSCULAR | Status: AC
  Filled 2019-03-21: qty 2

## 2019-03-21 MED ORDER — DEXAMETHASONE SODIUM PHOSPHATE 4 MG/ML IJ SOLN
INTRAMUSCULAR | Status: DC | PRN
  Administered 2019-03-21: 10 mg via INTRAVENOUS

## 2019-03-21 MED ORDER — SUCCINYLCHOLINE CHLORIDE 200 MG/10ML IV SOSY
PREFILLED_SYRINGE | INTRAVENOUS | Status: AC
  Filled 2019-03-21: qty 10

## 2019-03-21 MED ORDER — FENTANYL CITRATE (PF) 100 MCG/2ML IJ SOLN
INTRAMUSCULAR | Status: DC | PRN
  Administered 2019-03-21: 100 ug via INTRAVENOUS

## 2019-03-21 MED ORDER — EPHEDRINE SULFATE 50 MG/ML IJ SOLN
INTRAMUSCULAR | Status: DC | PRN
  Administered 2019-03-21: 10 mg via INTRAVENOUS

## 2019-03-21 MED ORDER — MIDAZOLAM HCL 2 MG/2ML IJ SOLN
INTRAMUSCULAR | Status: AC
  Filled 2019-03-21: qty 2

## 2019-03-21 MED ORDER — MIDAZOLAM HCL 5 MG/5ML IJ SOLN
INTRAMUSCULAR | Status: DC | PRN
  Administered 2019-03-21: 2 mg via INTRAVENOUS

## 2019-03-21 MED ORDER — CHLORHEXIDINE GLUCONATE CLOTH 2 % EX PADS: 6.0000 | MEDICATED_PAD | Freq: Once | CUTANEOUS | Status: DC

## 2019-03-21 MED ORDER — GABAPENTIN 300 MG PO CAPS
300.0000 mg | ORAL_CAPSULE | ORAL | Status: AC
  Administered 2019-03-21: 300 mg via ORAL

## 2019-03-21 MED ORDER — BUPIVACAINE HCL (PF) 0.25 % IJ SOLN
INTRAMUSCULAR | Status: AC
  Filled 2019-03-21: qty 30

## 2019-03-21 MED ORDER — ACETAMINOPHEN 500 MG PO TABS
1000.0000 mg | ORAL_TABLET | ORAL | Status: AC
  Administered 2019-03-21: 1000 mg via ORAL

## 2019-03-21 MED ORDER — ONDANSETRON HCL 4 MG/2ML IJ SOLN
4.0000 mg | Freq: Once | INTRAMUSCULAR | Status: AC | PRN
  Administered 2019-03-21: 4 mg via INTRAVENOUS

## 2019-03-21 MED ORDER — ACETAMINOPHEN 500 MG PO TABS
ORAL_TABLET | ORAL | Status: AC
  Filled 2019-03-21: qty 2

## 2019-03-21 MED ORDER — SUGAMMADEX SODIUM 200 MG/2ML IV SOLN
INTRAVENOUS | Status: DC | PRN
  Administered 2019-03-21: 200 mg via INTRAVENOUS

## 2019-03-21 MED ORDER — PROPOFOL 10 MG/ML IV BOLUS
INTRAVENOUS | Status: DC | PRN
  Administered 2019-03-21: 180 mg via INTRAVENOUS

## 2019-03-21 MED ORDER — CEFAZOLIN SODIUM-DEXTROSE 2-4 GM/100ML-% IV SOLN
INTRAVENOUS | Status: AC
  Filled 2019-03-21: qty 100

## 2019-03-21 MED ORDER — GABAPENTIN 300 MG PO CAPS
ORAL_CAPSULE | ORAL | Status: AC
  Filled 2019-03-21: qty 1

## 2019-03-21 MED ORDER — LACTATED RINGERS IV SOLN: INTRAVENOUS | Status: DC

## 2019-03-21 MED ORDER — 0.9 % SODIUM CHLORIDE (POUR BTL) OPTIME
TOPICAL | Status: DC | PRN
  Administered 2019-03-21: 10 mL

## 2019-03-21 MED ORDER — CEFAZOLIN SODIUM-DEXTROSE 2-4 GM/100ML-% IV SOLN
2.0000 g | INTRAVENOUS | Status: AC
  Administered 2019-03-21: 2 g via INTRAVENOUS

## 2019-03-21 MED ORDER — ROCURONIUM BROMIDE 100 MG/10ML IV SOLN
INTRAVENOUS | Status: DC | PRN
  Administered 2019-03-21: 80 mg via INTRAVENOUS

## 2019-03-21 MED ORDER — BUPIVACAINE HCL (PF) 0.25 % IJ SOLN
INTRAMUSCULAR | Status: DC | PRN
  Administered 2019-03-21: 10 mL

## 2019-03-21 MED ORDER — OXYCODONE HCL 5 MG PO TABS: 5.0000 mg | ORAL_TABLET | Freq: Four times a day (QID) | ORAL | 0 refills | Status: DC | PRN

## 2019-03-21 MED ORDER — ONDANSETRON HCL 4 MG/2ML IJ SOLN
INTRAMUSCULAR | Status: DC | PRN
  Administered 2019-03-21: 4 mg via INTRAVENOUS

## 2019-03-21 MED ORDER — LIDOCAINE HCL (CARDIAC) PF 100 MG/5ML IV SOSY
PREFILLED_SYRINGE | INTRAVENOUS | Status: DC | PRN
  Administered 2019-03-21: 100 mg via INTRAVENOUS

## 2019-03-21 MED ORDER — HYDROMORPHONE HCL 1 MG/ML IJ SOLN
INTRAMUSCULAR | Status: AC
  Filled 2019-03-21: qty 0.5

## 2019-03-21 SURGICAL SUPPLY — 56 items
APL PRP STRL LF DISP 70% ISPRP (MISCELLANEOUS)
APL SKNCLS STERI-STRIP NONHPOA (GAUZE/BANDAGES/DRESSINGS)
BALL CTTN LRG ABS STRL LF (GAUZE/BANDAGES/DRESSINGS) ×1
BENZOIN TINCTURE PRP APPL 2/3 (GAUZE/BANDAGES/DRESSINGS) ×2 IMPLANT
BLADE CLIPPER SURG (BLADE) ×1 IMPLANT
BLADE HEX COATED 2.75 (ELECTRODE) ×1 IMPLANT
BLADE SURG 15 STRL LF DISP TIS (BLADE) ×1 IMPLANT
BLADE SURG 15 STRL SS (BLADE) ×3
BNDG GAUZE ELAST 4 BULKY (GAUZE/BANDAGES/DRESSINGS) IMPLANT
BRIEF STRETCH FOR OB PAD XXL (UNDERPADS AND DIAPERS) ×1 IMPLANT
CANISTER SUCT 1200ML W/VALVE (MISCELLANEOUS) ×3 IMPLANT
CHLORAPREP W/TINT 26 (MISCELLANEOUS) IMPLANT
CLOSURE WOUND 1/2 X4 (GAUZE/BANDAGES/DRESSINGS)
COTTONBALL LRG STERILE PKG (GAUZE/BANDAGES/DRESSINGS) ×2 IMPLANT
COVER BACK TABLE 60X90IN (DRAPES) ×3 IMPLANT
COVER MAYO STAND STRL (DRAPES) ×3 IMPLANT
COVER WAND RF STERILE (DRAPES) IMPLANT
DECANTER SPIKE VIAL GLASS SM (MISCELLANEOUS) ×2 IMPLANT
DRAPE LAPAROTOMY T 102X78X121 (DRAPES) ×3 IMPLANT
DRAPE UTILITY XL STRL (DRAPES) ×3 IMPLANT
DRSG PAD ABDOMINAL 8X10 ST (GAUZE/BANDAGES/DRESSINGS) IMPLANT
ELECT REM PT RETURN 9FT ADLT (ELECTROSURGICAL) ×3
ELECTRODE REM PT RTRN 9FT ADLT (ELECTROSURGICAL) ×1 IMPLANT
GAUZE 4X4 16PLY RFD (DISPOSABLE) IMPLANT
GAUZE SPONGE 4X4 12PLY STRL (GAUZE/BANDAGES/DRESSINGS) ×2 IMPLANT
GAUZE SPONGE 4X4 12PLY STRL LF (GAUZE/BANDAGES/DRESSINGS) ×6 IMPLANT
GLOVE BIO SURGEON STRL SZ7.5 (GLOVE) ×3 IMPLANT
GLOVE BIOGEL PI IND STRL 7.0 (GLOVE) IMPLANT
GLOVE BIOGEL PI IND STRL 7.5 (GLOVE) IMPLANT
GLOVE BIOGEL PI INDICATOR 7.0 (GLOVE) ×2
GLOVE BIOGEL PI INDICATOR 7.5 (GLOVE) ×2
GLOVE SURG SS PI 7.5 STRL IVOR (GLOVE) ×2 IMPLANT
GOWN STRL REUS W/ TWL LRG LVL3 (GOWN DISPOSABLE) ×2 IMPLANT
GOWN STRL REUS W/TWL LRG LVL3 (GOWN DISPOSABLE) ×3
MATRIX WOUND 3-LAYER 5X5 (Tissue) ×1 IMPLANT
MICROMATRIX 500MG (Tissue) ×3 IMPLANT
NDL HYPO 25X1 1.5 SAFETY (NEEDLE) ×1 IMPLANT
NEEDLE HYPO 25X1 1.5 SAFETY (NEEDLE) ×3 IMPLANT
NS IRRIG 1000ML POUR BTL (IV SOLUTION) ×2 IMPLANT
PACK BASIN DAY SURGERY FS (CUSTOM PROCEDURE TRAY) ×3 IMPLANT
PENCIL SMOKE EVACUATOR (MISCELLANEOUS) ×3 IMPLANT
SOLUTION PARTIC MCRMTRX 500MG (Tissue) IMPLANT
SPONGE LAP 18X18 RF (DISPOSABLE) IMPLANT
STRIP CLOSURE SKIN 1/2X4 (GAUZE/BANDAGES/DRESSINGS) IMPLANT
SUT MON AB 5-0 PS2 18 (SUTURE) ×2 IMPLANT
SUT PROLENE 3 0 PS 2 (SUTURE) IMPLANT
SUT PROLENE 4 0 PS 2 18 (SUTURE) IMPLANT
SUT VICRYL 3-0 CR8 SH (SUTURE) IMPLANT
SYR CONTROL 10ML LL (SYRINGE) ×3 IMPLANT
TAPE CLOTH 3X10 TAN LF (GAUZE/BANDAGES/DRESSINGS) ×3 IMPLANT
TOWEL GREEN STERILE FF (TOWEL DISPOSABLE) ×4 IMPLANT
TRAY DSU PREP LF (CUSTOM PROCEDURE TRAY) ×2 IMPLANT
TUBE CONNECTING 20'X1/4 (TUBING) ×1
TUBE CONNECTING 20X1/4 (TUBING) ×2 IMPLANT
WOUND MATRIX 3-LAYER 5X5 (Tissue) ×1 IMPLANT
YANKAUER SUCT BULB TIP NO VENT (SUCTIONS) ×3 IMPLANT

## 2019-03-21 NOTE — Op Note (Signed)
03/21/2019  9:53 AM  PATIENT:  Kerry Duncan  39 y.o. female  PRE-OPERATIVE DIAGNOSIS:  PILONIDAL ABSCESS  POST-OPERATIVE DIAGNOSIS:  PILONIDAL ABSCESS  PROCEDURE:  Procedure(s): INCISION AND DRAINAGE OF PILONIDAL ABSCESS (N/A) APPLICATION OF A-CELL (N/A)  SURGEON:  Surgeon(s) and Role:    * Griselda Miner, MD - Primary  PHYSICIAN ASSISTANT:   ASSISTANTS: none   ANESTHESIA:   local and general  EBL:  10 mL   BLOOD ADMINISTERED:none  DRAINS: none   LOCAL MEDICATIONS USED:  MARCAINE     SPECIMEN:  No Specimen  DISPOSITION OF SPECIMEN:  N/A  COUNTS:  YES  TOURNIQUET:  * No tourniquets in log *  DICTATION: .Dragon Dictation   After informed consent was obtained the patient was brought to the operating room and placed in the supine position on the stretcher.  After adequate induction of general anesthesia the patient was moved into a prone position on the operating room table and all pressure points were padded.  The buttocks were retracted laterally with tape.  The gluteal cleft and perirectal region was prepped with Betadine and draped in the usual sterile manner.  An appropriate timeout was performed.  A small silver probe was used to probe the single opening in the gluteal cleft.  The underlying cavity was then opened over top of the silver probe with the electrocautery.  The lining of the cavity was excised sharply with the electrocautery.  Hemostasis was achieved using the Bovie electrocautery.  At this point the wound was very clean and only measured about 3 cm in diameter. Micromatrix 500mg  was then applied to the wound along with a Cytal 3 layer 5x5 sheet.  A small piece of Adaptic was then placed over the sheet and the cavity was packed with a cotton ball soaked in surgical lube.  Sterile dressings were then applied.  The patient tolerated procedure well.  At the end of the case all needle sponge and instrument counts were correct.  The patient was then awakened and  taken to recovery in stable condition.  PLAN OF CARE: Discharge to home after PACU  PATIENT DISPOSITION:  PACU - hemodynamically stable.   Delay start of Pharmacological VTE agent (>24hrs) due to surgical blood loss or risk of bleeding: not applicable

## 2019-03-21 NOTE — Transfer of Care (Signed)
Immediate Anesthesia Transfer of Care Note  Patient: Kerry Duncan  Procedure(s) Performed: INCISION AND DRAINAGE OF PILONIDAL ABSCESS (N/A Back) APPLICATION OF A-CELL (N/A Back)  Patient Location: PACU  Anesthesia Type:General  Level of Consciousness: awake and patient cooperative  Airway & Oxygen Therapy: Patient Spontanous Breathing and Patient connected to face mask oxygen  Post-op Assessment: Report given to RN and Post -op Vital signs reviewed and stable  Post vital signs: Reviewed and stable  Last Vitals:  Vitals Value Taken Time  BP 136/66 03/21/19 1006  Temp    Pulse 88 03/21/19 1009  Resp 32 03/21/19 1009  SpO2 100 % 03/21/19 1009  Vitals shown include unvalidated device data.  Last Pain:  Vitals:   03/21/19 0823  TempSrc: Oral  PainSc: 0-No pain         Complications: No apparent anesthesia complications

## 2019-03-21 NOTE — Anesthesia Procedure Notes (Signed)
Procedure Name: Intubation Date/Time: 03/21/2019 9:10 AM Performed by: Walker Desanctis, CRNA Pre-anesthesia Checklist: Patient identified, Emergency Drugs available, Suction available, Patient being monitored and Timeout performed Patient Re-evaluated:Patient Re-evaluated prior to induction Oxygen Delivery Method: Circle system utilized Preoxygenation: Pre-oxygenation with 100% oxygen Induction Type: IV induction Ventilation: Mask ventilation without difficulty Laryngoscope Size: Miller and 3 Tube type: Oral Tube size: 7.0 mm Number of attempts: 1 Airway Equipment and Method: Stylet and Oral airway Placement Confirmation: ETT inserted through vocal cords under direct vision,  positive ETCO2 and breath sounds checked- equal and bilateral Secured at: 22 cm Tube secured with: Tape Dental Injury: Teeth and Oropharynx as per pre-operative assessment

## 2019-03-21 NOTE — H&P (Signed)
Kerry Duncan  Location: [facility] Surgery Patient #: [id number] DOB: [birth year] Married / Language: English / Race: White Female   History of Present Illness  The patient is a 39 year old female who presents with a pilonidal cyst. We are asked to see the patient in consultation by Dr. Johnn Hai to evaluate her for a recurrent pilonidal abscess. The patient is a 39 year old white female who first developed an abscess in her gluteal cleft about 10 years ago. She then went about 8 years without any problems but since the summer time the area has become inflamed and drains about every 4 weeks. She denies any fevers or chills. She does smoke. She also has asthma.   Past Surgical History  Oral Surgery   Diagnostic Studies History  Colonoscopy  never Mammogram  never  Allergies  Eggs/Apples/Oats *DIETARY PRODUCTS/DIETARY MANAGEMENT PRODUCTS*  Sulfur *CHEMICALS*  Topamax *ANTICONVULSANTS*  Keppra *ANTICONVULSANTS*  Gabapentin *ANTICONVULSANTS*  Allergies Reconciled   Medication History  Excedrin (250-250-65MG  Tablet, Oral) Active. Fioricet (50-300-40MG  Capsule, Oral) Active. Medications Reconciled  Social History Caffeine use  Coffee, Tea. No alcohol use  No drug use  Tobacco use  Current some day smoker.  Family History Cervical Cancer  Sister. Colon Polyps  Mother. Diabetes Mellitus  Father, Mother. Heart Disease  Father, Mother. Heart disease in female family member before age 9  Heart disease in female family member before age 34  Hypertension  Mother. Respiratory Condition  Father.  Pregnancy / Birth History  Age at menarche  68 years. Gravida  3 Length (months) of breastfeeding  3-6 Maternal age  47-20 Para  1 Regular periods   Other Problems  Asthma  Back Pain  Migraine Headache  Seizure Disorder     Review of Systems  General Not Present- Appetite Loss, Chills, Fatigue, Fever, Night Sweats, Weight  Gain and Weight Loss. Skin Present- New Lesions. Not Present- Change in Wart/Mole, Dryness, Hives, Jaundice, Non-Healing Wounds, Rash and Ulcer. HEENT Present- Seasonal Allergies and Wears glasses/contact lenses. Not Present- Earache, Hearing Loss, Hoarseness, Nose Bleed, Oral Ulcers, Ringing in the Ears, Sinus Pain, Sore Throat, Visual Disturbances and Yellow Eyes. Respiratory Not Present- Bloody sputum, Chronic Cough, Difficulty Breathing, Snoring and Wheezing. Breast Not Present- Breast Mass, Breast Pain, Nipple Discharge and Skin Changes. Cardiovascular Not Present- Chest Pain, Difficulty Breathing Lying Down, Leg Cramps, Palpitations, Rapid Heart Rate, Shortness of Breath and Swelling of Extremities. Gastrointestinal Not Present- Abdominal Pain, Bloating, Bloody Stool, Change in Bowel Habits, Chronic diarrhea, Constipation, Difficulty Swallowing, Excessive gas, Gets full quickly at meals, Hemorrhoids, Indigestion, Nausea, Rectal Pain and Vomiting. Female Genitourinary Not Present- Frequency, Nocturia, Painful Urination, Pelvic Pain and Urgency. Musculoskeletal Present- Back Pain. Not Present- Joint Pain, Joint Stiffness, Muscle Pain, Muscle Weakness and Swelling of Extremities. Neurological Present- Headaches. Not Present- Decreased Memory, Fainting, Numbness, Seizures, Tingling, Tremor, Trouble walking and Weakness. Psychiatric Not Present- Anxiety, Bipolar, Change in Sleep Pattern, Depression, Fearful and Frequent crying. Endocrine Not Present- Cold Intolerance, Excessive Hunger, Hair Changes, Heat Intolerance, Hot flashes and New Diabetes. Hematology Present- Blood Thinners. Not Present- Easy Bruising, Excessive bleeding, Gland problems, HIV and Persistent Infections.  Vitals  Weight: 169 lb Height: 67in Body Surface Area: 1.88 m Body Mass Index: 26.47 kg/m  Temp.: 98.51F  Pulse: 101 (Regular)  BP: 132/70 (Sitting, Left Arm, Standard)       Physical  Exam General Mental Status-Alert. General Appearance-Consistent with stated age. Hydration-Well hydrated. Voice-Normal.  Head and Neck Head-normocephalic, atraumatic with no lesions or  palpable masses. Trachea-midline. Thyroid Gland Characteristics - normal size and consistency.  Eye Eyeball - Bilateral-Extraocular movements intact. Sclera/Conjunctiva - Bilateral-No scleral icterus.  Chest and Lung Exam Chest and lung exam reveals -quiet, even and easy respiratory effort with no use of accessory muscles and on auscultation, normal breath sounds, no adventitious sounds and normal vocal resonance. Inspection Chest Wall - Normal. Back - normal.  Cardiovascular Cardiovascular examination reveals -normal heart sounds, regular rate and rhythm with no murmurs and normal pedal pulses bilaterally.  Abdomen Inspection Inspection of the abdomen reveals - No Hernias. Skin - Scar - no surgical scars. Palpation/Percussion Palpation and Percussion of the abdomen reveal - Soft, Non Tender, No Rebound tenderness, No Rigidity (guarding) and No hepatosplenomegaly. Auscultation Auscultation of the abdomen reveals - Bowel sounds normal.  Rectal Note: There appears to be a scar just to the left of midline in the gluteal cleft with 1 obvious pit opening in the skin. There is no sign of drainage or cellulitis today.   Neurologic Neurologic evaluation reveals -alert and oriented x 3 with no impairment of recent or remote memory. Mental Status-Normal.  Musculoskeletal Normal Exam - Left-Upper Extremity Strength Normal and Lower Extremity Strength Normal. Normal Exam - Right-Upper Extremity Strength Normal and Lower Extremity Strength Normal.  Lymphatic Head & Neck  General Head & Neck Lymphatics: Bilateral - Description - Normal. Axillary  General Axillary Region: Bilateral - Description - Normal. Tenderness - Non Tender. Femoral & Inguinal  Generalized  Femoral & Inguinal Lymphatics: Bilateral - Description - Normal. Tenderness - Non Tender.    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 Because of the recurrent nature of this I think it would be beneficial to have this area incised and drained. I would plan to leave it open and do dressing changes until it healed. It may benefit from use of acellular matrix to them prove and speed up the wound healing if this is approved. I have discussed with her in detail the risk and benefits of the operation as well as some of the technical aspects and she understands and wishes to proceed. This patient encounter took 15 minutes today to perform the following: take history, perform exam, review outside records, interpret imaging, counsel the patient on their diagnosis and document encounter, findings & plan in the EHR

## 2019-03-21 NOTE — Anesthesia Preprocedure Evaluation (Signed)
Anesthesia Evaluation  Patient identified by MRN, date of birth, ID band Patient awake    Reviewed: Allergy & Precautions, NPO status , Patient's Chart, lab work & pertinent test results  Airway Mallampati: I  TM Distance: >3 FB Neck ROM: Full    Dental   Pulmonary asthma , Current Smoker and Patient abstained from smoking.,    Pulmonary exam normal        Cardiovascular Normal cardiovascular exam     Neuro/Psych Seizures -,  Anxiety    GI/Hepatic   Endo/Other    Renal/GU      Musculoskeletal   Abdominal   Peds  Hematology   Anesthesia Other Findings   Reproductive/Obstetrics                             Anesthesia Physical Anesthesia Plan  ASA: II  Anesthesia Plan: General   Post-op Pain Management:    Induction: Intravenous  PONV Risk Score and Plan: 2  Airway Management Planned: Oral ETT  Additional Equipment:   Intra-op Plan:   Post-operative Plan: Extubation in OR  Informed Consent: I have reviewed the patients History and Physical, chart, labs and discussed the procedure including the risks, benefits and alternatives for the proposed anesthesia with the patient or authorized representative who has indicated his/her understanding and acceptance.       Plan Discussed with: CRNA and Surgeon  Anesthesia Plan Comments:         Anesthesia Quick Evaluation

## 2019-03-21 NOTE — Discharge Instructions (Signed)

## 2019-03-21 NOTE — Interval H&P Note (Signed)
History and Physical Interval Note:  03/21/2019 8:03 AM  Kerry Duncan  has presented today for surgery, with the diagnosis of PILONIDAL ABSCESS.  The various methods of treatment have been discussed with the patient and family. After consideration of risks, benefits and other options for treatment, the patient has consented to  Procedure(s): INCISION AND DRAINAGE OF PILONIDAL ABSCESS (N/A) APPLICATION OF A-CELL (N/A) as a surgical intervention.  The patient's history has been reviewed, patient examined, no change in status, stable for surgery.  I have reviewed the patient's chart and labs.  Questions were answered to the patient's satisfaction.     Chevis Pretty III

## 2019-03-21 NOTE — Anesthesia Postprocedure Evaluation (Signed)
Anesthesia Post Note  Patient: ARRIAH WADLE  Procedure(s) Performed: INCISION AND DRAINAGE OF PILONIDAL ABSCESS (N/A Back) APPLICATION OF A-CELL (N/A Back)     Patient location during evaluation: PACU Anesthesia Type: General Level of consciousness: awake and alert Pain management: pain level controlled Vital Signs Assessment: post-procedure vital signs reviewed and stable Respiratory status: spontaneous breathing, nonlabored ventilation, respiratory function stable and patient connected to nasal cannula oxygen Cardiovascular status: blood pressure returned to baseline and stable Postop Assessment: no apparent nausea or vomiting Anesthetic complications: no    Last Vitals:  Vitals:   03/21/19 1045 03/21/19 1100  BP: (!) 109/99 128/83  Pulse: 66 81  Resp:  16  Temp:  (!) 36.4 C  SpO2: 100% 100%    Last Pain:  Vitals:   03/21/19 1100  TempSrc:   PainSc: 2                  Kimley Apsey DAVID

## 2019-03-24 ENCOUNTER — Encounter: Payer: Self-pay | Admitting: *Deleted

## 2019-05-15 ENCOUNTER — Other Ambulatory Visit: Payer: Self-pay | Admitting: Obstetrics and Gynecology

## 2019-05-20 ENCOUNTER — Telehealth: Payer: Self-pay | Admitting: Hematology

## 2019-05-20 NOTE — Telephone Encounter (Signed)
Scheduled appt per 4/16 sch message - unable to reach pt. Left message and mailed reminder letter with appt

## 2019-06-18 ENCOUNTER — Other Ambulatory Visit: Payer: Self-pay | Admitting: *Deleted

## 2019-06-18 DIAGNOSIS — D509 Iron deficiency anemia, unspecified: Secondary | ICD-10-CM

## 2019-06-18 DIAGNOSIS — D6859 Other primary thrombophilia: Secondary | ICD-10-CM

## 2019-06-18 NOTE — Progress Notes (Signed)
HEMATOLOGY/ONCOLOGY CONSULTATION NOTE  Date of Service: 06/19/2019  Patient Care Team: Olevia Perches as PCP - General (Internal Medicine) Levert Feinstein, MD as Consulting Physician (Oncology) Olivia Mackie, MD as Consulting Physician (Obstetrics and Gynecology)  CHIEF COMPLAINTS/PURPOSE OF CONSULTATION:  Protein S Deficiency  HISTORY OF PRESENTING ILLNESS:   Kerry Duncan is a wonderful 39 y.o. female who has been referred to [place] by Dr. Cephas Darby for evaluation and management of Protein S Deficiency. The patient's last visit with [place] was on 07/04/2018. The pt reports that she is doing well overall.  The pt reports she is good. Pt is currently [redacted] weeks pregnant. She was placed back on 40mg  lovenox starting at 4 weeks of pregnancy. Pt reports there is no preconception hormonal use. Previously pt took iron pills but it made no impact on her iron levels and was sent to IV iron infusions.   Pt has hx of early heart disease in the family. Her father had his first heart attack at age 75 and had several other risk factors. Pt passed away from heart failure with several heart attacks previously. On her fathers side a first cousin had heart attack. On her mothers side there has been several strokes including her mother and mothers father.   Lab results today (06/19/19) of CBC w/diff and CMP is as follows: all values are WNL except for Hemoglobin at 11.1, HCT at 33.9, RDW at 18.4, Total Bilirubin at <0.2 06/19/19 of Fettitin at 7 06/19/19 of Iron and TIBC is as follows: all values are WNL except for Saturation Ratios at 14  On review of systems, pt reports fatigue and denies blood clots, pedal edema, vaginal bleeding, abdominal pain, ice cravings and any other symptoms.   MEDICAL HISTORY:  Past Medical History:  Diagnosis Date  . Anemia, iron deficiency 04/24/2017  . Asthma   . Protein C deficiency (HCC)    tested in 2008 and found to not have protein C deficiency    . Protein S deficiency (HCC)   . PTSD (post-traumatic stress disorder)   . Seizure (HCC)    none since 2019    SURGICAL HISTORY: Past Surgical History:  Procedure Laterality Date  . APPLICATION OF A-CELL OF CHEST/ABDOMEN N/A 03/21/2019   Procedure: APPLICATION OF A-CELL;  Surgeon: 03/23/2019, MD;  Location: [facility];  Service: General;  Laterality: N/A;  . DILATION AND CURETTAGE OF UTERUS    . INCISION AND DRAINAGE ABSCESS N/A 03/21/2019   Procedure: INCISION AND DRAINAGE OF PILONIDAL ABSCESS;  Surgeon: 03/23/2019, MD;  Location: [facility];  Service: General;  Laterality: N/A;  . MOUTH SURGERY      SOCIAL HISTORY: Social History   Socioeconomic History  . Marital status: Married    Spouse name: Not on file  . Number of children: Not on file  . Years of education: Not on file  . Highest education level: Not on file  Occupational History  . Not on file  Tobacco Use  . Smoking status: Current Some Day Smoker    Packs/day: 0.50    Types: Cigarettes  . Smokeless tobacco: Never Used  Substance and Sexual Activity  . Alcohol use: No  . Drug use: No  . Sexual activity: Yes    Birth control/protection: None  Other Topics Concern  . Not on file  Social History Narrative  . Not on file   Social Determinants of Health   Financial Resource  Strain:   . Difficulty of Paying Living Expenses:   Food Insecurity:   . Worried About Programme researcher, broadcasting/film/video in the Last Year:   . Barista in the Last Year:   Transportation Needs:   . Freight forwarder (Medical):   Marland Kitchen Lack of Transportation (Non-Medical):   Physical Activity:   . Days of Exercise per Week:   . Minutes of Exercise per Session:   Stress:   . Feeling of Stress :   Social Connections:   . Frequency of Communication with Friends and Family:   . Frequency of Social Gatherings with Friends and Family:   . Attends Religious Services:   . Active Member of Clubs or  Organizations:   . Attends Banker Meetings:   Marland Kitchen Marital Status:   Intimate Partner Violence:   . Fear of Current or Ex-Partner:   . Emotionally Abused:   Marland Kitchen Physically Abused:   . Sexually Abused:     FAMILY HISTORY: Family History  Problem Relation Age of Onset  . Hypertension Mother   . Diabetes Mother   . Varicose Veins Mother   . Hypothyroidism Mother   . Irritable bowel syndrome Mother   . Stroke Mother   . Heart disease Father   . Heart attack Father   . Hypertension Father   . Asthma Father   . COPD Father   . Seizures Sister   . Stroke Maternal Grandfather     ALLERGIES:  is allergic to topamax [topiramate]; aleve [naproxen sodium]; eggs or egg-derived products; mobic [meloxicam]; and keppra [levetiracetam].  MEDICATIONS:  Current Outpatient Medications  Medication Sig Dispense Refill  . albuterol (PROVENTIL HFA;VENTOLIN HFA) 108 (90 BASE) MCG/ACT inhaler Inhale 2 puffs into the lungs every 4 (four) hours as needed. For wheezing    . Butalbital-APAP-Caffeine 50-300-40 MG CAPS Take 1 capsule by mouth 2 (two) times daily as needed. As needed for headache     No current facility-administered medications for this visit.    REVIEW OF SYSTEMS:   A 10+ POINT REVIEW OF SYSTEMS WAS OBTAINED including neurology, dermatology, psychiatry, cardiac, respiratory, lymph, extremities, GI, GU, Musculoskeletal, constitutional, breasts, reproductive, HEENT.  All pertinent positives are noted in the HPI.  All others are negative.   PHYSICAL EXAMINATION:  . Vitals:   06/19/19 1359  BP: 123/78  Pulse: 90  Resp: 18  Temp: 99.7 F (37.6 C)  SpO2: 100%   Filed Weights   06/19/19 1359  Weight: 149 lb 9.6 oz (67.9 kg)   .Body mass index is 23.43 kg/m.  GENERAL:alert, in no acute distress and comfortable SKIN: no acute rashes, no significant lesions EYES: conjunctiva are pink and non-injected, sclera anicteric OROPHARYNX: MMM, no exudates, no oropharyngeal  erythema or ulceration NECK: supple, no JVD LYMPH:  no palpable lymphadenopathy in the cervical, axillary or inguinal regions LUNGS: clear to auscultation b/l with normal respiratory effort HEART: regular rate & rhythm ABDOMEN:  normoactive bowel sounds , non tender, not distended. Extremity: no pedal edema PSYCH: alert & oriented x 3 with fluent speech NEURO: no focal motor/sensory deficits  LABORATORY DATA:  I have reviewed the data as listed  . CBC Latest Ref Rng & Units 06/19/2019 07/04/2018 01/01/2018  WBC 4.0 - 10.5 K/uL 6.9 8.9 7.2  Hemoglobin 12.0 - 15.0 g/dL 11.1(L) 13.2 13.5  Hematocrit 36.0 - 46.0 % 33.9(L) 40.2 40.7  Platelets 150 - 400 K/uL 245 252 267    . CMP Latest Ref Rng &  Units 06/19/2019 07/04/2018 11/27/2017  Glucose 70 - 99 mg/dL 83 87 779(T)  BUN 6 - 20 mg/dL 6 9 6   Creatinine 0.44 - 1.00 mg/dL 9.03 0.09)  Sodium 135 - 145 mmol/L 138 138 135  Potassium 3.5 - 5.1 mmol/L 3.5 3.9 3.9  Chloride 98 - 111 mmol/L 104 106 105  CO2 22 - 32 mmol/L 24 23 22   Calcium 8.9 - 10.3 mg/dL 9.1 2.33(A) )  Total Protein 6.5 - 8.1 g/dL 6.8 6.8 0.7(M)  Total Bilirubin 0.3 - 1.2 mg/dL 2.2(Q) 0.3 0.6  Alkaline Phos 38 - 126 U/L 70 65 100  AST 15 - 41 U/L 20 19 22   ALT 0 - 44 U/L 27 17 18    Component     Latest Ref Rng & Units 07/04/2018  Iron     41 - 142 ug/dL <5.4(T  TIBC     - ug/dL 09/03/2018  Saturation Ratios     21 - 57 % 34  UIBC     120 - 384 ug/dL 625  Protein S, Total     60 - 150 % 77  Protein S, Free     57 - 157 % 24 (L)  Protein S-Functional     63 - 140 % 65  Vitamin B12     180 - 914 pg/mL 317  Ferritin     11 - 307 ng/mL 12    [facility] test name PAI 1 POLYMORPHISM   Comment: Performed at [facility], [address], [place], [facility]  Misc [facility] result COMMENT   Comment: (NOTE)  Test Ordered: [phone] PAI-1 Gene Polymorphism  PAI-1 Locus 4G/5G Polymorphism Comment          UY   Patient DNA  was evaluated for the PAI-1 4G/5G promoter  polymorphism, which is a single base pair guanine (4G/5G)  deletion/insertion polymorphism, using polymerase chain  reaction (PCR) technology and restriction fragment length  polymorphism (RFLP).  Results            4G/4G           UY   Homozygous for the 4G deletion allele.  Interpretation         Comment          UY   This individual has two copies of the 4G allele, also known  as the 4G/4G genotype of the plasminogen activator  inhibitor type 1 (PAI-1) gene. The 4G/4G genotype is  associated with the highest PAI-1 activity and antigen  levels compared to those individuals that have either the  5G/5G or 4G/5G genotype. The 4G/4G genotype is associated  with enhanced transcription and PAI levels about 25% higher  than those with a 5G/5G genotype. Elevated PAI-1 levels are  associated with an increased risk of coronary artery  disease, venous thromboembolic disease and possibly  complications of pregnancy such as recurrent abortion.  Comments            Comment          UY   Simultaneous Risks: If a patient possesses two or more  congenital or acquired risk factors, the risk of disease  may rise to more than the sum of the risk ratios for the  individual risk factors. For instance, a combination of the  4G/4G genotype and the insulin resistance syndrome may  confer an increase in cardiovascular disease risk over that  conferred by the presence of an isolated PAI-1 4G/4G  polymorphism.       RADIOGRAPHIC STUDIES: I have personally reviewed the radiological images as listed and agreed with the findings in the report. No results found.  ASSESSMENT & PLAN:   40 y.o. female with  1. 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 Free Protein S levels were low at 21%, with repeat of  18%. History of two pregnancy losses in 2008 at 26 weeks and first trimester respectively. No history of blood clots Has had prophylactic anticoagulation during pregnancy with uncomplicated vaginal delivery on 11/27/17  2. 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 Can beassociated with increased levels of PAI with possibility of early heart disease, increased risk of miscarriage and VTE PLAN: -Discussed pt labwork today, 06/19/19; of CBC w/diff and CMP is as follows: all values are WNL except for Hemoglobin at 11.1, HCT at 33.9, RDW at 18.4, Total Bilirubin at <0.2 -Discussed -Discussed 06/19/19 of Fettitin at 7 06/19/19 of Iron and TIBC is as follows: all values are WNL except for Saturation Ratios at 14 -Will need to be mindful of preventative anticoagulation peri and post-operatively for future surgeries -Discussed that if pt chose to take 81mg  aspirin this would not be unreasonable, but taking long term blood thinners is not strongly indicated -Recommend periodic ambulation and wearing compression socks on long distance travel. Recommend staying well hydrated and avoiding excessive caffeine and alcohol consumption on long distance travel -Advised on family hx of early heart diease  -Advised on mild anemia  -Advised on iron levels especially during pregnancy  -Advised on protein PAI 4G/4G deletion allele from 07/04/19 - -Advised PAI being additional risk factor for VTE and miscarriage -Continue taking prenatal vitamins  -Recommend 60mg  Lovenox in pregnancies during pregnancy and until 6 weeks post delivery. -wpould recommend ASA 81mg  po daily after off lovenox and off breast feeding. -Recommends taking oral iron -125mg  of Iron polysaccharide daily -if sensitive stomach use dye free, gelatin free, etc.  -Recommends iron polysaccharide not taking with dairy products -Recommends IV iron in late second trimester  -patients noted that lovenox needs to go through [facility] and that she would like continue this with Dr Ronita Hipps since he has sent the current prescription. -Continue f/u with PCP for cardiovascular risk reduction  -Will get labs in 11 weeks  -Phone visit in 12 weeks   FOLLOW UP: Labs in 11 weeks Phone visit in 12 weeks  The total time spent in the appt was 30 minutes and more than 50% was on counseling and direct patient cares.  All of the patient's questions were answered with apparent satisfaction. The patient knows to call the clinic with any problems, questions or concerns.  Sullivan Lone MD [place] AAHIVMS [facility] Hematology/Oncology Physician [facility]  (Office):       [phone] (Work cell):  [phone] (Fax):           [phone]  06/19/2019 4:33 PM  I, Dawayne Cirri am acting as a Education administrator for Dr. Sullivan Lone.   .I have reviewed the above documentation for accuracy and completeness, and I agree with the above. Brunetta Genera MD

## 2019-06-19 ENCOUNTER — Other Ambulatory Visit: Payer: Self-pay

## 2019-06-19 ENCOUNTER — Inpatient Hospital Stay: Payer: 59 | Attending: Hematology | Admitting: Hematology

## 2019-06-19 ENCOUNTER — Inpatient Hospital Stay: Payer: 59

## 2019-06-19 VITALS — BP 123/78 | HR 90 | Temp 99.7°F | Resp 18 | Ht 67.0 in | Wt 149.6 lb

## 2019-06-19 DIAGNOSIS — D649 Anemia, unspecified: Secondary | ICD-10-CM | POA: Diagnosis not present

## 2019-06-19 DIAGNOSIS — Z3A12 12 weeks gestation of pregnancy: Secondary | ICD-10-CM | POA: Diagnosis not present

## 2019-06-19 DIAGNOSIS — D6859 Other primary thrombophilia: Secondary | ICD-10-CM

## 2019-06-19 DIAGNOSIS — O99511 Diseases of the respiratory system complicating pregnancy, first trimester: Secondary | ICD-10-CM | POA: Insufficient documentation

## 2019-06-19 DIAGNOSIS — Z79899 Other long term (current) drug therapy: Secondary | ICD-10-CM | POA: Diagnosis not present

## 2019-06-19 DIAGNOSIS — F17210 Nicotine dependence, cigarettes, uncomplicated: Secondary | ICD-10-CM | POA: Insufficient documentation

## 2019-06-19 DIAGNOSIS — O99111 Other diseases of the blood and blood-forming organs and certain disorders involving the immune mechanism complicating pregnancy, first trimester: Secondary | ICD-10-CM | POA: Diagnosis present

## 2019-06-19 DIAGNOSIS — D509 Iron deficiency anemia, unspecified: Secondary | ICD-10-CM | POA: Diagnosis not present

## 2019-06-19 DIAGNOSIS — O99331 Smoking (tobacco) complicating pregnancy, first trimester: Secondary | ICD-10-CM | POA: Diagnosis not present

## 2019-06-19 DIAGNOSIS — O99011 Anemia complicating pregnancy, first trimester: Secondary | ICD-10-CM | POA: Diagnosis not present

## 2019-06-19 LAB — FERRITIN: Ferritin: 7 ng/mL — ABNORMAL LOW (ref 11–307)

## 2019-06-19 LAB — CBC WITH DIFFERENTIAL (CANCER CENTER ONLY)
Abs Immature Granulocytes: 0.03 10*3/uL (ref 0.00–0.07)
Basophils Absolute: 0 10*3/uL (ref 0.0–0.1)
Basophils Relative: 0 %
Eosinophils Absolute: 0.1 10*3/uL (ref 0.0–0.5)
Eosinophils Relative: 1 %
HCT: 33.9 % — ABNORMAL LOW (ref 36.0–46.0)
Hemoglobin: 11.1 g/dL — ABNORMAL LOW (ref 12.0–15.0)
Immature Granulocytes: 0 %
Lymphocytes Relative: 15 %
Lymphs Abs: 1.1 10*3/uL (ref 0.7–4.0)
MCH: 27.1 pg (ref 26.0–34.0)
MCHC: 32.7 g/dL (ref 30.0–36.0)
MCV: 82.7 fL (ref 80.0–100.0)
Monocytes Absolute: 0.4 10*3/uL (ref 0.1–1.0)
Monocytes Relative: 6 %
Neutro Abs: 5.3 10*3/uL (ref 1.7–7.7)
Neutrophils Relative %: 78 %
Platelet Count: 245 10*3/uL (ref 150–400)
RBC: 4.1 MIL/uL (ref 3.87–5.11)
RDW: 18.4 % — ABNORMAL HIGH (ref 11.5–15.5)
WBC Count: 6.9 10*3/uL (ref 4.0–10.5)
nRBC: 0 % (ref 0.0–0.2)

## 2019-06-19 LAB — CMP (CANCER CENTER ONLY)
ALT: 27 U/L (ref 0–44)
AST: 20 U/L (ref 15–41)
Albumin: 4 g/dL (ref 3.5–5.0)
Alkaline Phosphatase: 70 U/L (ref 38–126)
Anion gap: 10 (ref 5–15)
BUN: 6 mg/dL (ref 6–20)
CO2: 24 mmol/L (ref 22–32)
Calcium: 9.1 mg/dL (ref 8.9–10.3)
Chloride: 104 mmol/L (ref 98–111)
Creatinine: 0.61 mg/dL (ref 0.44–1.00)
GFR, Est AFR Am: >60 mL/min (ref >60)
GFR, Estimated: >60 mL/min (ref >60)
Glucose, Bld: 83 mg/dL (ref 70–99)
Potassium: 3.5 mmol/L (ref 3.5–5.1)
Sodium: 138 mmol/L (ref 135–145)
Total Bilirubin: <0.2 mg/dL — ABNORMAL LOW (ref 0.3–1.2)
Total Protein: 6.8 g/dL (ref 6.5–8.1)

## 2019-06-19 LAB — IRON AND TIBC
Iron: 47 ug/dL (ref 41–142)
Saturation Ratios: 14 % — ABNORMAL LOW (ref 21–57)
TIBC: 339 ug/dL (ref 236–444)
UIBC: 292 ug/dL (ref 120–384)

## 2019-06-19 LAB — SAMPLE TO BLOOD BANK

## 2019-06-19 NOTE — Patient Instructions (Signed)
Thank you for choosing [facility] to provide your oncology and hematology care.   Should you have questions after your visit to the [facility] ([facility]), please contact this office at [phone] between 8:30 AM and 4:30 PM.  Voice mails left after 4:00 PM may not be returned until the following business day.  Calls received after 4:30 PM will be answered by an off-site Nurse Triage Line.    Prescription Refills:  Please have your pharmacy contact us directly for most prescription requests.  Contact the office directly for refills of narcotics (pain medications). Allow 48-72 hours for refills.  Appointments: Please contact the [facility] scheduling department [phone] for questions regarding [facility] appointment scheduling.  Contact the schedulers with any scheduling changes so that your appointment can be rescheduled in a timely manner.   Central Scheduling for [place] [phone] - Call to schedule procedures such as PET scans, CT scans, MRI, Ultrasound, etc.  To afford each patient quality time with our providers, please arrive 30 minutes before your scheduled appointment time.  If you arrive late for your appointment, you may be asked to reschedule.  We strive to give you quality time with our providers, and arriving late affects you and other patients whose appointments are after yours. If you are a no show for multiple scheduled visits, you may be dismissed from the clinic at the providers discretion.     Resources: [facility] Social Workers [phone] for additional information on assistance programs or assistance connecting with community support programs   [place] DSS  336-641-3447: Information regarding food stamps, Medicaid, and utility assistance [place] 336-333-6589   [place] [employer]'s shared-ride transportation service for eligible riders who have a disability that prevents them from riding the fixed route bus.   Medicare Rights Center  800-333-4114 Helps people with Medicare understand their rights and benefits, navigate the Medicare system, and secure the quality healthcare they deserve [employer] 800-227-2345 Assists patients locate various types of support and financial assistance Cancer Care: 1-800-813-HOPE (4673) Provides financial assistance, online support groups, medication/co-pay assistance.   Transportation Assistance for appointments at [facility]: Transportation Coordinator [phone]  Again, thank you for choosing  Cancer Center for your care.       

## 2019-07-17 ENCOUNTER — Telehealth: Payer: Self-pay | Admitting: Hematology

## 2019-07-17 ENCOUNTER — Other Ambulatory Visit: Payer: Self-pay | Admitting: Obstetrics and Gynecology

## 2019-07-17 NOTE — Telephone Encounter (Signed)
Scheduled per los, patient has been called and notified. 

## 2019-07-22 ENCOUNTER — Other Ambulatory Visit: Payer: Self-pay | Admitting: Obstetrics and Gynecology

## 2019-07-25 NOTE — Discharge Instructions (Signed)

## 2019-07-28 ENCOUNTER — Ambulatory Visit (HOSPITAL_COMMUNITY)
Admission: RE | Admit: 2019-07-28 | Discharge: 2019-07-28 | Disposition: A | Discharge status: Home / Self Care | Payer: 59 | Source: Ambulatory Visit | Attending: Obstetrics and Gynecology | Admitting: Obstetrics and Gynecology

## 2019-07-28 ENCOUNTER — Other Ambulatory Visit: Payer: Self-pay

## 2019-07-28 DIAGNOSIS — D649 Anemia, unspecified: Secondary | ICD-10-CM | POA: Diagnosis not present

## 2019-07-28 MED ORDER — SODIUM CHLORIDE 0.9 % IV SOLN
200.0000 mg | INTRAVENOUS | Status: DC
  Administered 2019-07-28: 200 mg via INTRAVENOUS
  Filled 2019-07-28: qty 10

## 2019-07-31 ENCOUNTER — Other Ambulatory Visit: Payer: Self-pay

## 2019-07-31 ENCOUNTER — Encounter (HOSPITAL_COMMUNITY)
Admission: RE | Admit: 2019-07-31 | Discharge: 2019-07-31 | Disposition: A | Discharge status: Home / Self Care | Payer: 59 | Source: Ambulatory Visit | Attending: Obstetrics and Gynecology | Admitting: Obstetrics and Gynecology

## 2019-07-31 DIAGNOSIS — D509 Iron deficiency anemia, unspecified: Secondary | ICD-10-CM | POA: Diagnosis present

## 2019-07-31 MED ORDER — SODIUM CHLORIDE 0.9 % IV SOLN
200.0000 mg | INTRAVENOUS | Status: DC
  Administered 2019-07-31: 200 mg via INTRAVENOUS
  Filled 2019-07-31: qty 10

## 2019-08-05 ENCOUNTER — Other Ambulatory Visit: Payer: Self-pay

## 2019-08-05 ENCOUNTER — Encounter (HOSPITAL_COMMUNITY)
Admission: RE | Admit: 2019-08-05 | Discharge: 2019-08-05 | Disposition: A | Discharge status: Home / Self Care | Payer: 59 | Source: Ambulatory Visit | Attending: Obstetrics and Gynecology | Admitting: Obstetrics and Gynecology

## 2019-08-05 DIAGNOSIS — D509 Iron deficiency anemia, unspecified: Secondary | ICD-10-CM | POA: Diagnosis not present

## 2019-08-05 MED ORDER — SODIUM CHLORIDE 0.9 % IV SOLN
200.0000 mg | INTRAVENOUS | Status: DC
  Administered 2019-08-05: 200 mg via INTRAVENOUS
  Filled 2019-08-05: qty 10

## 2019-08-07 ENCOUNTER — Encounter (HOSPITAL_COMMUNITY)
Admission: RE | Admit: 2019-08-07 | Discharge: 2019-08-07 | Disposition: A | Discharge status: Home / Self Care | Payer: 59 | Source: Ambulatory Visit | Attending: Obstetrics and Gynecology | Admitting: Obstetrics and Gynecology

## 2019-08-07 ENCOUNTER — Other Ambulatory Visit: Payer: Self-pay

## 2019-08-07 DIAGNOSIS — D509 Iron deficiency anemia, unspecified: Secondary | ICD-10-CM | POA: Diagnosis not present

## 2019-08-07 MED ORDER — SODIUM CHLORIDE 0.9 % IV SOLN
200.0000 mg | INTRAVENOUS | Status: DC
  Administered 2019-08-07: 200 mg via INTRAVENOUS
  Filled 2019-08-07: qty 10

## 2019-08-11 ENCOUNTER — Encounter (HOSPITAL_COMMUNITY)
Admission: RE | Admit: 2019-08-11 | Discharge: 2019-08-11 | Disposition: A | Discharge status: Home / Self Care | Payer: 59 | Source: Ambulatory Visit | Attending: Obstetrics and Gynecology | Admitting: Obstetrics and Gynecology

## 2019-08-11 ENCOUNTER — Other Ambulatory Visit: Payer: Self-pay

## 2019-08-11 DIAGNOSIS — D509 Iron deficiency anemia, unspecified: Secondary | ICD-10-CM | POA: Diagnosis not present

## 2019-08-11 MED ORDER — SODIUM CHLORIDE 0.9 % IV SOLN
200.0000 mg | INTRAVENOUS | Status: DC
  Administered 2019-08-11: 200 mg via INTRAVENOUS
  Filled 2019-08-11: qty 10

## 2019-08-14 ENCOUNTER — Ambulatory Visit (HOSPITAL_COMMUNITY): Payer: 59

## 2019-09-04 ENCOUNTER — Inpatient Hospital Stay: Payer: 59 | Attending: Hematology

## 2019-09-04 ENCOUNTER — Other Ambulatory Visit: Payer: Self-pay

## 2019-09-04 DIAGNOSIS — Z79899 Other long term (current) drug therapy: Secondary | ICD-10-CM | POA: Diagnosis not present

## 2019-09-04 DIAGNOSIS — D509 Iron deficiency anemia, unspecified: Secondary | ICD-10-CM | POA: Insufficient documentation

## 2019-09-04 DIAGNOSIS — D6859 Other primary thrombophilia: Secondary | ICD-10-CM | POA: Insufficient documentation

## 2019-09-04 DIAGNOSIS — O99112 Other diseases of the blood and blood-forming organs and certain disorders involving the immune mechanism complicating pregnancy, second trimester: Secondary | ICD-10-CM | POA: Insufficient documentation

## 2019-09-04 DIAGNOSIS — F17210 Nicotine dependence, cigarettes, uncomplicated: Secondary | ICD-10-CM | POA: Insufficient documentation

## 2019-09-04 DIAGNOSIS — J45909 Unspecified asthma, uncomplicated: Secondary | ICD-10-CM | POA: Diagnosis not present

## 2019-09-04 LAB — CBC WITH DIFFERENTIAL/PLATELET
Abs Immature Granulocytes: 0.03 10*3/uL (ref 0.00–0.07)
Basophils Absolute: 0 10*3/uL (ref 0.0–0.1)
Basophils Relative: 0 %
Eosinophils Absolute: 0.2 10*3/uL (ref 0.0–0.5)
Eosinophils Relative: 2 %
HCT: 34.8 % — ABNORMAL LOW (ref 36.0–46.0)
Hemoglobin: 11.6 g/dL — ABNORMAL LOW (ref 12.0–15.0)
Immature Granulocytes: 0 %
Lymphocytes Relative: 14 %
Lymphs Abs: 1.1 10*3/uL (ref 0.7–4.0)
MCH: 30.9 pg (ref 26.0–34.0)
MCHC: 33.3 g/dL (ref 30.0–36.0)
MCV: 92.8 fL (ref 80.0–100.0)
Monocytes Absolute: 0.4 10*3/uL (ref 0.1–1.0)
Monocytes Relative: 5 %
Neutro Abs: 5.8 10*3/uL (ref 1.7–7.7)
Neutrophils Relative %: 79 %
Platelets: 190 10*3/uL (ref 150–400)
RBC: 3.75 MIL/uL — ABNORMAL LOW (ref 3.87–5.11)
RDW: 14.8 % (ref 11.5–15.5)
WBC: 7.5 10*3/uL (ref 4.0–10.5)
nRBC: 0 % (ref 0.0–0.2)

## 2019-09-04 LAB — CMP (CANCER CENTER ONLY)
ALT: 28 U/L (ref 0–44)
AST: 18 U/L (ref 15–41)
Albumin: 3.5 g/dL (ref 3.5–5.0)
Alkaline Phosphatase: 64 U/L (ref 38–126)
Anion gap: 7 (ref 5–15)
BUN: 7 mg/dL (ref 6–20)
CO2: 24 mmol/L (ref 22–32)
Calcium: 9 mg/dL (ref 8.9–10.3)
Chloride: 106 mmol/L (ref 98–111)
Creatinine: 0.57 mg/dL (ref 0.44–1.00)
GFR, Est AFR Am: >60 mL/min (ref >60)
GFR, Estimated: >60 mL/min (ref >60)
Glucose, Bld: 81 mg/dL (ref 70–99)
Potassium: 4 mmol/L (ref 3.5–5.1)
Sodium: 137 mmol/L (ref 135–145)
Total Bilirubin: <0.2 mg/dL — ABNORMAL LOW (ref 0.3–1.2)
Total Protein: 6.3 g/dL — ABNORMAL LOW (ref 6.5–8.1)

## 2019-09-04 LAB — FERRITIN: Ferritin: 59 ng/mL (ref 11–307)

## 2019-09-10 ENCOUNTER — Telehealth: Payer: Self-pay | Admitting: Hematology

## 2019-09-10 NOTE — Progress Notes (Signed)
HEMATOLOGY/ONCOLOGY CONSULTATION NOTE  Date of Service: 09/11/2019  Patient Care Team: Olevia Perches as PCP - General (Internal Medicine) Levert Feinstein, MD as Consulting Physician (Oncology) Olivia Mackie, MD as Consulting Physician (Obstetrics and Gynecology)  CHIEF COMPLAINTS/PURPOSE OF CONSULTATION:  Protein S Deficiency  HISTORY OF PRESENTING ILLNESS:  I connected with Kerry Duncan on 09/11/19 at  9:40 AM EDT and verified that I am speaking with the correct person using two identifiers.   I discussed the limitations, risks, security and privacy concerns of performing an evaluation and management service by telemedicine and the availability of in-person appointments. I also discussed with the patient that there may be a patient responsible charge related to this service. The patient expressed understanding and agreed to proceed.   Other persons participating in the visit and their role in the encounter:     -Dorthula Perfect, Medical Scribe   Patient's location: Home  Provider's location: [facility] at [facility]   Kerry Duncan is a wonderful 39 y.o. female who has been referred to [place] by Dr. Cephas Darby for evaluation and management of Protein S Deficiency. The patient's last visit with [place] was on 06/19/19. The pt reports that she is doing well overall.  The pt reports she is good. Pt is currently [redacted] weeks pregnant and she had iron infusions last month. She had been taking oral iron and was not absorbing it well. Pt also tried to take liquid iron and she had diarrhea or about a week so she did not tolerate it well. Her energy levels were helped by the iron infusions. She is still slightly fatigued. Pt was feeling SOB prior to the infusions. In 2019 pt got a Feraheme infusion with her other pregnancy. Pt is tolerating Lovenox shots well.   Lab results today (09/04/19) of CBC w/diff and CMP is as follows: all values are WNL except for RBC at 3.75, Hemoglobin  at 11.6, HCT at 34.8, Total Protein at 6.3, Total Bilirubin at <0.2 09/04/19 of Ferritin at 59: WNL  On review of systems, pt reports fatigue and denies any other symptoms.   MEDICAL HISTORY:  Past Medical History:  Diagnosis Date  . Anemia, iron deficiency 04/24/2017  . Asthma   . Protein C deficiency (HCC)    tested in 2008 and found to not have protein C deficiency  . Protein S deficiency (HCC)   . PTSD (post-traumatic stress disorder)   . Seizure (HCC)    none since 2019    SURGICAL HISTORY: Past Surgical History:  Procedure Laterality Date  . APPLICATION OF A-CELL OF CHEST/ABDOMEN N/A 03/21/2019   Procedure: APPLICATION OF A-CELL;  Surgeon: Griselda Miner, MD;  Location: [facility];  Service: General;  Laterality: N/A;  . DILATION AND CURETTAGE OF UTERUS    . INCISION AND DRAINAGE ABSCESS N/A 03/21/2019   Procedure: INCISION AND DRAINAGE OF PILONIDAL ABSCESS;  Surgeon: Griselda Miner, MD;  Location: [facility];  Service: General;  Laterality: N/A;  . MOUTH SURGERY      SOCIAL HISTORY: Social History   Socioeconomic History  . Marital status: Married    Spouse name: Not on file  . Number of children: Not on file  . Years of education: Not on file  . Highest education level: Not on file  Occupational History  . Not on file  Tobacco Use  . Smoking status: Current Some Day Smoker    Packs/day: 0.50  Types: Cigarettes  . Smokeless tobacco: Never Used  Vaping Use  . Vaping Use: Never used  Substance and Sexual Activity  . Alcohol use: No  . Drug use: No  . Sexual activity: Yes    Birth control/protection: None  Other Topics Concern  . Not on file  Social History Narrative  . Not on file   Social Determinants of Health   Financial Resource Strain:   . Difficulty of Paying Living Expenses:   Food Insecurity:   . Worried About Programme researcher, broadcasting/film/videounning Out of Food in the Last Year:   . Baristaan Out of Food in the Last Year:   Transportation Needs:     . Freight forwarderLack of Transportation (Medical):   Marland Kitchen. Lack of Transportation (Non-Medical):   Physical Activity:   . Days of Exercise per Week:   . Minutes of Exercise per Session:   Stress:   . Feeling of Stress :   Social Connections:   . Frequency of Communication with Friends and Family:   . Frequency of Social Gatherings with Friends and Family:   . Attends Religious Services:   . Active Member of Clubs or Organizations:   . Attends BankerClub or Organization Meetings:   Marland Kitchen. Marital Status:   Intimate Partner Violence:   . Fear of Current or Ex-Partner:   . Emotionally Abused:   Marland Kitchen. Physically Abused:   . Sexually Abused:     FAMILY HISTORY: Family History  Problem Relation Age of Onset  . Hypertension Mother   . Diabetes Mother   . Varicose Veins Mother   . Hypothyroidism Mother   . Irritable bowel syndrome Mother   . Stroke Mother   . Heart disease Father   . Heart attack Father   . Hypertension Father   . Asthma Father   . COPD Father   . Seizures Sister   . Stroke Maternal Grandfather     ALLERGIES:  is allergic to topamax [topiramate], aleve [naproxen sodium], eggs or egg-derived products, mobic [meloxicam], and keppra [levetiracetam].  MEDICATIONS:  Current Outpatient Medications  Medication Sig Dispense Refill  . albuterol (PROVENTIL HFA;VENTOLIN HFA) 108 (90 BASE) MCG/ACT inhaler Inhale 2 puffs into the lungs every 4 (four) hours as needed. For wheezing    . Butalbital-APAP-Caffeine 50-300-40 MG CAPS Take 1 capsule by mouth 2 (two) times daily as needed. As needed for headache     No current facility-administered medications for this visit.    REVIEW OF SYSTEMS:   A 10+ POINT REVIEW OF SYSTEMS WAS OBTAINED including neurology, dermatology, psychiatry, cardiac, respiratory, lymph, extremities, GI, GU, Musculoskeletal, constitutional, breasts, reproductive, HEENT.  All pertinent positives are noted in the HPI.  All others are negative.   PHYSICAL EXAMINATION:  Tele-Health  Visit  LABORATORY DATA:  I have reviewed the data as listed  . CBC Latest Ref Rng & Units 09/04/2019 06/19/2019 07/04/2018  WBC 4.0 - 10.5 K/uL 7.5 6.9 8.9  Hemoglobin 12.0 - 15.0 g/dL 11.6(L) 11.1(L) 13.2  Hematocrit 36 - 46 % 34.8(L) 33.9(L) 40.2  Platelets 150 - 400 K/uL 190 245 252    . CMP Latest Ref Rng & Units 09/04/2019 06/19/2019 07/04/2018  Glucose 70 - 99 mg/dL 81 83 87  BUN 6 - 20 mg/dL 7 6 9   Creatinine 0.44 - 1.00 mg/dL 9.600.57 4.540.61 0.980.73  Sodium 135 - 145 mmol/L 137 138 138  Potassium 3.5 - 5.1 mmol/L 4.0 3.5 3.9  Chloride 98 - 111 mmol/L 106 104 106  CO2 22 - 32 mmol/L  24 24 23   Calcium 8.9 - 10.3 mg/dL 9.0 9.1 )  Total Protein 6.5 - 8.1 g/dL 6.3(L) 6.8 6.8  Total Bilirubin 0.3 - 1.2 mg/dL 8.3(M) <6.2(H) 0.3  Alkaline Phos 38 - 126 U/L 64 70 65  AST 15 - 41 U/L 18 20 19   ALT 0 - 44 U/L 28 27 17    Component     Latest Ref Rng & Units 07/04/2018  Iron     41 - 142 ug/dL  TIBC     - 09/03/2018 ug/dL 546  Saturation Ratios     21 - 57 % 34  UIBC     120 - 384 ug/dL 503  Protein S, Total     60 - 150 % 77  Protein S, Free     57 - 157 % 24 (L)  Protein S-Functional     63 - 140 % 65  Vitamin B12     180 - 914 pg/mL 317  Ferritin     11 - 307 ng/mL 12    [facility] test name PAI 1 POLYMORPHISM   Comment: Performed at [facility], [address], [place], [place]  Misc [facility] result COMMENT   Comment: (NOTE)  Test Ordered: [id number] PAI-1 Gene Polymorphism  PAI-1 Locus 4G/5G Polymorphism Comment          UY   Patient DNA was evaluated for the PAI-1 4G/5G promoter  polymorphism, which is a single base pair guanine (4G/5G)  deletion/insertion polymorphism, using polymerase chain  reaction (PCR) technology and restriction fragment length  polymorphism (RFLP).  Results            4G/4G           UY   Homozygous for the 4G deletion allele.  Interpretation         Comment           UY   This individual has two copies of the 4G allele, also known  as the 4G/4G genotype of the plasminogen activator  inhibitor type 1 (PAI-1) gene. The 4G/4G genotype is  associated with the highest PAI-1 activity and antigen  levels compared to those individuals that have either the  5G/5G or 4G/5G genotype. The 4G/4G genotype is associated  with enhanced transcription and PAI levels about 25% higher  than those with a 5G/5G genotype. Elevated PAI-1 levels are  associated with an increased risk of coronary artery  disease, venous thromboembolic disease and possibly  complications of pregnancy such as recurrent abortion.  Comments            Comment          UY   Simultaneous Risks: If a patient possesses two or more  congenital or acquired risk factors, the risk of disease  may rise to more than the sum of the risk ratios for the  individual risk factors. For instance, a combination of the  4G/4G genotype and the insulin resistance syndrome may  confer an increase in cardiovascular disease risk over that  conferred by the presence of an isolated PAI-1 4G/4G  polymorphism.       RADIOGRAPHIC STUDIES: I have personally reviewed the radiological images as listed and agreed with the findings in the report. No results found.  ASSESSMENT & PLAN:   39 y.o. female with  1. 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  Free Protein S levels were low at 21%, with repeat of 18%. History of two pregnancy losses in 2008 at 26 weeks and first trimester respectively. No history of blood clots Has had prophylactic anticoagulation during pregnancy with uncomplicated vaginal delivery on 11/27/17  2. 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 Can beassociated with increased levels of PAI with possibility of early heart disease, increased risk of  miscarriage and VTE PLAN: -Discussed pt labwork today, 09/04/19; of CBC w/diff and CMP is as follows: all values are WNL except for RBC at 3.75, Hemoglobin at 11.6, HCT at 34.8, Total Protein at 6.3, Total Bilirubin at <0.2 -Discussed 09/04/19 of Ferritin at 59: WNL -Will need to be mindful of preventative anticoagulation peri and post-operatively for future surgeries -Discussed that if pt chose to take 81mg  aspirin this would not be unreasonable, but taking long term blood thinners is not strongly indicated -Recommend periodic ambulation and wearing compression socks on long distance travel. Recommend staying well hydrated and avoiding excessive caffeine and alcohol consumption on long distance travel -Advised on iron levels especially during pregnancy -Advised PAI being additional risk factor for VTE and miscarriage -Continue taking prenatal vitamins  -Recommend 60mg  Lovenox in pregnancies during pregnancy and until 6 weeks post delivery. -Would recommend ASA 81mg  po daily after off lovenox and off breast feeding. -Advised on other forms of Iron infusions after pregnancy  -Patients noted that lovenox needs to go through [facility] and that she would like continue this with Dr since he has sent the current prescription. -Will see back as needed  -Will see back in 1 year   FOLLOW UP: RTC with Dr with labs in 12 months  The total time spent in the appt was 20 minutes and more than 50% was on counseling and direct patient cares.  All of the patient's questions were answered with apparent satisfaction. The patient knows to call the clinic with any problems, questions or concerns.   MD MS AAHIVMS [facility] Hematology/Oncology Physician [facility]  (Office):       [phone] (Work cell):  [phone] (Fax):           [phone]  09/11/2019 10:07 AM  I, [phone] am acting as a [phone] for Dr. 11/11/2019.   .I have reviewed the  above documentation for accuracy and completeness, and I agree with the above. Dorthula Perfect MD

## 2019-09-11 ENCOUNTER — Inpatient Hospital Stay (HOSPITAL_BASED_OUTPATIENT_CLINIC_OR_DEPARTMENT_OTHER): Payer: 59 | Admitting: Hematology

## 2019-09-11 ENCOUNTER — Other Ambulatory Visit: Payer: Self-pay | Admitting: Obstetrics and Gynecology

## 2019-09-11 DIAGNOSIS — D509 Iron deficiency anemia, unspecified: Secondary | ICD-10-CM

## 2019-09-11 DIAGNOSIS — D6859 Other primary thrombophilia: Secondary | ICD-10-CM | POA: Diagnosis not present

## 2019-09-15 ENCOUNTER — Telehealth: Payer: Self-pay | Admitting: Hematology

## 2019-09-15 NOTE — Telephone Encounter (Signed)
Scheduled per los, patient has been called and notified. 

## 2019-10-15 ENCOUNTER — Other Ambulatory Visit: Payer: Self-pay | Admitting: Obstetrics and Gynecology

## 2019-10-15 DIAGNOSIS — Q631 Lobulated, fused and horseshoe kidney: Secondary | ICD-10-CM

## 2019-10-28 ENCOUNTER — Encounter: Payer: Self-pay | Admitting: *Deleted

## 2019-10-30 ENCOUNTER — Other Ambulatory Visit: Payer: Self-pay

## 2019-10-30 ENCOUNTER — Other Ambulatory Visit: Payer: Self-pay | Admitting: Obstetrics and Gynecology

## 2019-10-30 ENCOUNTER — Ambulatory Visit: Payer: 59 | Attending: Obstetrics and Gynecology

## 2019-10-30 DIAGNOSIS — Q631 Lobulated, fused and horseshoe kidney: Secondary | ICD-10-CM | POA: Diagnosis present

## 2019-10-30 DIAGNOSIS — O09523 Supervision of elderly multigravida, third trimester: Secondary | ICD-10-CM | POA: Diagnosis not present

## 2019-12-01 LAB — OB RESULTS CONSOLE GBS: GBS: NEGATIVE

## 2019-12-17 ENCOUNTER — Inpatient Hospital Stay (HOSPITAL_COMMUNITY)
Admission: AD | Admit: 2019-12-17 | Discharge: 2019-12-17 | Disposition: A | Discharge status: Home / Self Care | Payer: 59 | Attending: Obstetrics and Gynecology | Admitting: Obstetrics and Gynecology

## 2019-12-17 ENCOUNTER — Other Ambulatory Visit: Payer: Self-pay

## 2019-12-17 ENCOUNTER — Encounter (HOSPITAL_COMMUNITY): Payer: Self-pay | Admitting: Obstetrics and Gynecology

## 2019-12-17 DIAGNOSIS — O471 False labor at or after 37 completed weeks of gestation: Secondary | ICD-10-CM | POA: Diagnosis not present

## 2019-12-17 DIAGNOSIS — R030 Elevated blood-pressure reading, without diagnosis of hypertension: Secondary | ICD-10-CM | POA: Diagnosis not present

## 2019-12-17 DIAGNOSIS — O99333 Smoking (tobacco) complicating pregnancy, third trimester: Secondary | ICD-10-CM | POA: Insufficient documentation

## 2019-12-17 DIAGNOSIS — R519 Headache, unspecified: Secondary | ICD-10-CM | POA: Diagnosis not present

## 2019-12-17 DIAGNOSIS — Z7901 Long term (current) use of anticoagulants: Secondary | ICD-10-CM | POA: Insufficient documentation

## 2019-12-17 DIAGNOSIS — Z3A37 37 weeks gestation of pregnancy: Secondary | ICD-10-CM | POA: Diagnosis not present

## 2019-12-17 DIAGNOSIS — O26893 Other specified pregnancy related conditions, third trimester: Secondary | ICD-10-CM | POA: Diagnosis not present

## 2019-12-17 DIAGNOSIS — O09523 Supervision of elderly multigravida, third trimester: Secondary | ICD-10-CM | POA: Diagnosis not present

## 2019-12-17 DIAGNOSIS — F17210 Nicotine dependence, cigarettes, uncomplicated: Secondary | ICD-10-CM | POA: Insufficient documentation

## 2019-12-17 DIAGNOSIS — Z3490 Encounter for supervision of normal pregnancy, unspecified, unspecified trimester: Secondary | ICD-10-CM

## 2019-12-17 LAB — CBC
HCT: 36.3 % (ref 36.0–46.0)
Hemoglobin: 12.3 g/dL (ref 12.0–15.0)
MCH: 31 pg (ref 26.0–34.0)
MCHC: 33.9 g/dL (ref 30.0–36.0)
MCV: 91.4 fL (ref 80.0–100.0)
Platelets: 204 10*3/uL (ref 150–400)
RBC: 3.97 MIL/uL (ref 3.87–5.11)
RDW: 13.9 % (ref 11.5–15.5)
WBC: 11.1 10*3/uL — ABNORMAL HIGH (ref 4.0–10.5)
nRBC: 0 % (ref 0.0–0.2)

## 2019-12-17 LAB — COMPREHENSIVE METABOLIC PANEL
ALT: 16 U/L (ref 0–44)
AST: 21 U/L (ref 15–41)
Albumin: 3.2 g/dL — ABNORMAL LOW (ref 3.5–5.0)
Alkaline Phosphatase: 100 U/L (ref 38–126)
Anion gap: 8 (ref 5–15)
BUN: 7 mg/dL (ref 6–20)
CO2: 20 mmol/L — ABNORMAL LOW (ref 22–32)
Calcium: 8.6 mg/dL — ABNORMAL LOW (ref 8.9–10.3)
Chloride: 104 mmol/L (ref 98–111)
Creatinine, Ser: 0.43 mg/dL — ABNORMAL LOW (ref 0.44–1.00)
GFR, Estimated: >60 mL/min (ref >60)
Glucose, Bld: 86 mg/dL (ref 70–99)
Potassium: 3.5 mmol/L (ref 3.5–5.1)
Sodium: 132 mmol/L — ABNORMAL LOW (ref 135–145)
Total Bilirubin: 0.3 mg/dL (ref 0.3–1.2)
Total Protein: 6.2 g/dL — ABNORMAL LOW (ref 6.5–8.1)

## 2019-12-17 LAB — PROTEIN / CREATININE RATIO, URINE
Creatinine, Urine: 55.42 mg/dL
Protein Creatinine Ratio: 0.22 mg/mg{Cre} — ABNORMAL HIGH (ref 0.00–0.15)
Total Protein, Urine: 12 mg/dL

## 2019-12-17 LAB — URINALYSIS, ROUTINE W REFLEX MICROSCOPIC
Bilirubin Urine: NEGATIVE
Glucose, UA: NEGATIVE mg/dL
Hgb urine dipstick: NEGATIVE
Ketones, ur: 5 mg/dL — AB
Leukocytes,Ua: NEGATIVE
Nitrite: NEGATIVE
Protein, ur: NEGATIVE mg/dL
Specific Gravity, Urine: 1.013 (ref 1.005–1.030)
pH: 7 (ref 5.0–8.0)

## 2019-12-17 NOTE — MAU Note (Signed)
I have communicated with D. Paul CNMand reviewed vital signs:  Vitals:   12/17/19 1922 12/17/19 1938  BP:  127/77  Pulse:  77  Resp:  16  Temp:    SpO2: 98% 98%    Vaginal exam:  Dilation: 3 Effacement (%): 60 Cervical Position: Middle Station: -1 Presentation: Vertex Exam by:: t Merari Pion RN ,   Also reviewed contraction pattern and that non-stress test is reactive.  It has been documented that patient is contracting irregularly w/UI * minutes with no cervical change over 1.5  hours not indicating active labor.  Patient denies any other complaints.  Based on this report provider has given order for discharge.  A discharge order and diagnosis entered by a provider.   Labor discharge instructions reviewed with patient.

## 2019-12-17 NOTE — MAU Note (Signed)
Presents with c/o ctxs that began irregularly but are now frequent.  Reports having nausea, no emesis.  Denies VB or LOF.  Endorses +FM.

## 2019-12-17 NOTE — MAU Note (Signed)
Pt reports feeling headache all day, has hx of migraines, did not take her Fiorecet, but took Tylenol. Headache continues.

## 2019-12-17 NOTE — MAU Provider Note (Signed)
History    Patient presented to MAU w/ c/o irregular painful contractions at home, now have subsided since at hospital. Denies LOF/VB. Also notes HA all day, has hx of migraines for which she takes Fioricet but did not use today, took Tylenol instead but not effective.  + FM No visual changes or RUQ pain.  Lives remotely 45 min from hospital.   CSN: [id number]  Arrival date and time: 12/17/19 1709   None     Chief Complaint  Patient presents with  . Contractions   HPI  OB History    Gravida  4   Para  2   Term  1   Preterm  1   AB  1   Living  1     SAB  1   TAB      Ectopic      Multiple  0   Live Births  1           Past Medical History:  Diagnosis Date  . Anemia, iron deficiency 04/24/2017  . Asthma   . Protein C deficiency (HCC)    tested in 2008 and found to not have protein C deficiency  . Protein S deficiency (HCC)   . PTSD (post-traumatic stress disorder)   . Seizure (HCC)    none since 2019    Past Surgical History:  Procedure Laterality Date  . APPLICATION OF A-CELL OF CHEST/ABDOMEN N/A 03/21/2019   Procedure: APPLICATION OF A-CELL;  Surgeon: Griselda Miner, MD;  Location: [facility];  Service: General;  Laterality: N/A;  . DILATION AND CURETTAGE OF UTERUS    . INCISION AND DRAINAGE ABSCESS N/A 03/21/2019   Procedure: INCISION AND DRAINAGE OF PILONIDAL ABSCESS;  Surgeon: Griselda Miner, MD;  Location: [facility];  Service: General;  Laterality: N/A;  . MOUTH SURGERY      Family History  Problem Relation Age of Onset  . Hypertension Mother   . Diabetes Mother   . Varicose Veins Mother   . Hypothyroidism Mother   . Irritable bowel syndrome Mother   . Stroke Mother   . Heart disease Father   . Heart attack Father   . Hypertension Father   . Asthma Father   . COPD Father   . Seizures Sister   . Stroke Maternal Grandfather     Social History   Tobacco Use  . Smoking status: Current Some Day  Smoker    Packs/day: 0.50    Types: Cigarettes  . Smokeless tobacco: Never Used  Vaping Use  . Vaping Use: Never used  Substance Use Topics  . Alcohol use: No  . Drug use: No    Allergies:  Allergies  Allergen Reactions  . Topamax [Topiramate] Other (See Comments)    Caused blurred vision, hair loss, weight loss  . Aleve [Naproxen Sodium] Nausea And Vomiting    Burns stomach  . Eggs Or Egg-Derived Products Swelling and Other (See Comments)    Stomach cramping  . Mobic [Meloxicam] Other (See Comments)    Ulcers.  . Keppra [Levetiracetam] Anxiety and Other (See Comments)    Causes suicidal ideation.    Medications Prior to Admission  Medication Sig Dispense Refill Last Dose  . Butalbital-APAP-Caffeine 50-300-40 MG CAPS Take 1 capsule by mouth 2 (two) times daily as needed. As needed for headache   12/16/2019 at Unknown time  . enoxaparin (LOVENOX) 40 MG/0.4ML injection Inject 40 mg into the skin daily.   12/16/2019 at  Unknown time  . Prenatal Vit-Fe Fumarate-FA (MULTIVITAMIN-PRENATAL) 27-0.8 MG TABS tablet Take 1 tablet by mouth daily at 12 noon.   12/17/2019 at Unknown time  . albuterol (PROVENTIL HFA;VENTOLIN HFA) 108 (90 BASE) MCG/ACT inhaler Inhale 2 puffs into the lungs every 4 (four) hours as needed. For wheezing   Unknown at Unknown time    Review of Systems Physical Exam   Blood pressure 134/89, pulse 87, temperature 98.2 F (36.8 C), temperature source Oral, resp. rate 20, height 5\' 7"  (1.702 m), weight 77.2 kg, last menstrual period 03/25/2019, SpO2 99 %. Patient Vitals for the past 24 hrs:  BP Temp Temp src Pulse Resp SpO2 Height Weight  12/17/19 1938 127/77 -- -- 77 16 98 % -- --  12/17/19 1922 -- -- -- -- -- 98 % -- --  12/17/19 1920 -- -- -- -- -- 98 % -- --  12/17/19 1916 127/77 -- -- 77 16 -- -- --  12/17/19 1915 -- -- -- -- -- 98 % -- --  12/17/19 1910 -- -- -- -- -- 97 % -- --  12/17/19 1905 -- -- -- -- -- 97 % -- --  12/17/19 1904 -- -- -- -- -- 97 %  -- --  12/17/19 1901 126/89 -- -- 80 -- -- -- --  12/17/19 1900 -- -- -- -- -- 97 % -- --  12/17/19 1855 -- -- -- -- -- 98 % -- --  12/17/19 1850 -- -- -- -- -- 97 % -- --  12/17/19 1846 130/81 -- -- 76 -- -- -- --  12/17/19 1845 -- -- -- -- -- 99 % -- --  12/17/19 1840 -- -- -- -- -- 98 % -- --  12/17/19 1835 -- -- -- -- -- 98 % -- --  12/17/19 1831 (!) 133/91 -- -- 77 -- -- -- --  12/17/19 1830 -- -- -- -- -- 98 % -- --  12/17/19 1825 -- -- -- -- -- 98 % -- --  12/17/19 1820 -- -- -- -- -- 97 % -- --  12/17/19 1816 136/78 -- -- 82 -- -- -- --  12/17/19 1815 -- -- -- -- -- 98 % -- --  12/17/19 1800 -- -- -- -- -- 99 % -- --  12/17/19 1758 134/89 -- -- 87 -- -- -- --  12/17/19 1755 -- -- -- -- -- 99 % -- --  12/17/19 1735 140/87 98.2 F (36.8 C) Oral 84 20 100 % -- --  12/17/19 1729 -- -- -- -- -- -- 5\' 7"  (1.702 m) 77.2 kg    Physical Exam AAO x 3 NAD CV RRR Pulm CTAB Abd: gravid, NT GU: Dilation: 3 Effacement (%): 60 Cervical Position: Middle Station: -1 Presentation: Vertex Exam by:: t lytle RN  Ext: no edema, neg clonus bilat  FHT 120 BPM, moderate variability, + accels, no decels Toco: mild irritability, no ctx palpated  MAU Course  Procedures Results for orders placed or performed during the hospital encounter of 12/17/19 (from the past 24 hour(s))  Urinalysis, Routine w reflex microscopic Urine, Clean Catch     Status: Abnormal   Collection Time: 12/17/19  5:57 PM  Result Value Ref Range   Color, Urine YELLOW YELLOW   APPearance CLEAR CLEAR   Specific Gravity, Urine 1.013 1.005 - 1.030   pH 7.0 5.0 - 8.0   Glucose, UA NEGATIVE NEGATIVE mg/dL   Hgb urine dipstick NEGATIVE NEGATIVE   Bilirubin Urine NEGATIVE NEGATIVE   Ketones, ur  5 (A) NEGATIVE mg/dL   Protein, ur NEGATIVE NEGATIVE mg/dL   Nitrite NEGATIVE NEGATIVE   Leukocytes,Ua NEGATIVE NEGATIVE  Protein / creatinine ratio, urine     Status: Abnormal   Collection Time: 12/17/19  6:19 PM  Result Value  Ref Range   Creatinine, Urine 55.42 mg/dL   Total Protein, Urine 12 mg/dL   Protein Creatinine Ratio 0.22 (H) 0.00 - 0.15 mg/mg[Cre]  CBC     Status: Abnormal   Collection Time: 12/17/19  6:29 PM  Result Value Ref Range   WBC 11.1 (H) 4.0 - 10.5 K/uL   RBC 3.97 3.87 - 5.11 MIL/uL   Hemoglobin 12.3 12.0 - 15.0 g/dL   HCT 34.7 36 - 46 %   MCV 91.4 80.0 - 100.0 fL   MCH 31.0 26.0 - 34.0 pg   MCHC 33.9 30.0 - 36.0 g/dL   RDW 42.5 95.6 - 38.7 %   Platelets 204 150 - 400 K/uL   nRBC 0.0 0.0 - 0.2 %  Comprehensive metabolic panel     Status: Abnormal   Collection Time: 12/17/19  6:29 PM  Result Value Ref Range   Sodium 132 (L) 135 - 145 mmol/L   Potassium 3.5 3.5 - 5.1 mmol/L   Chloride 104 98 - 111 mmol/L   CO2 20 (L) 22 - 32 mmol/L   Glucose, Bld 86 70 - 99 mg/dL   BUN 7 6 - 20 mg/dL   Creatinine, Ser 5.64 (L) 0.44 - 1.00 mg/dL   Calcium 8.6 (L) 8.9 - 10.3 mg/dL   Total Protein 6.2 (L) 6.5 - 8.1 g/dL   Albumin 3.2 (L) 3.5 - 5.0 g/dL   AST 21 15 - 41 U/L   ALT 16 0 - 44 U/L   Alkaline Phosphatase 100 38 - 126 U/L   Total Bilirubin 0.3 0.3 - 1.2 mg/dL   GFR, Estimated >33 >29 mL/min   Anion gap 8 5 - 15   MDM Labor check with repeat cervical exam in an hour noted no change and no regular contraction pattern noted.   PEC labs ordered in interim and serial blood pressures done for trend as noted above.    Assessment and Plan  39 y.o. [id number] at [redacted]w[redacted]d  Contractions NIL FHT category 1  Elevated BP initially w/o repeated abnormal range BP. Patient noted hx of PP hypertension with previous delivery.  PEC labs grossly normal, PCR urine 0.22  Will DC home with PEC precautions Close F/U in office in AM  POC in consult w/ Dr. Alvin Critchley 12/17/2019, 6:19 PM

## 2019-12-24 ENCOUNTER — Other Ambulatory Visit: Payer: Self-pay | Admitting: Obstetrics and Gynecology

## 2019-12-27 ENCOUNTER — Inpatient Hospital Stay (HOSPITAL_COMMUNITY): Payer: 59

## 2019-12-27 ENCOUNTER — Other Ambulatory Visit: Payer: Self-pay

## 2019-12-27 ENCOUNTER — Inpatient Hospital Stay (HOSPITAL_COMMUNITY)
Admission: AD | Admit: 2019-12-27 | Discharge: 2019-12-29 | DRG: 806 | Disposition: A | Discharge status: Home / Self Care | Payer: 59 | Attending: Obstetrics and Gynecology | Admitting: Obstetrics and Gynecology

## 2019-12-27 ENCOUNTER — Inpatient Hospital Stay (HOSPITAL_COMMUNITY): Payer: 59 | Admitting: Anesthesiology

## 2019-12-27 ENCOUNTER — Encounter (HOSPITAL_COMMUNITY): Payer: Self-pay | Admitting: Obstetrics and Gynecology

## 2019-12-27 DIAGNOSIS — O9081 Anemia of the puerperium: Secondary | ICD-10-CM | POA: Diagnosis not present

## 2019-12-27 DIAGNOSIS — O26893 Other specified pregnancy related conditions, third trimester: Secondary | ICD-10-CM | POA: Diagnosis present

## 2019-12-27 DIAGNOSIS — Z6791 Unspecified blood type, Rh negative: Secondary | ICD-10-CM | POA: Diagnosis not present

## 2019-12-27 DIAGNOSIS — Z20822 Contact with and (suspected) exposure to covid-19: Secondary | ICD-10-CM | POA: Diagnosis present

## 2019-12-27 DIAGNOSIS — J45909 Unspecified asthma, uncomplicated: Secondary | ICD-10-CM | POA: Diagnosis present

## 2019-12-27 DIAGNOSIS — D6859 Other primary thrombophilia: Secondary | ICD-10-CM | POA: Diagnosis present

## 2019-12-27 DIAGNOSIS — Z3A39 39 weeks gestation of pregnancy: Secondary | ICD-10-CM

## 2019-12-27 DIAGNOSIS — F17210 Nicotine dependence, cigarettes, uncomplicated: Secondary | ICD-10-CM | POA: Diagnosis present

## 2019-12-27 DIAGNOSIS — O134 Gestational [pregnancy-induced] hypertension without significant proteinuria, complicating childbirth: Secondary | ICD-10-CM | POA: Diagnosis present

## 2019-12-27 DIAGNOSIS — Z7901 Long term (current) use of anticoagulants: Secondary | ICD-10-CM

## 2019-12-27 DIAGNOSIS — O9952 Diseases of the respiratory system complicating childbirth: Secondary | ICD-10-CM | POA: Diagnosis present

## 2019-12-27 DIAGNOSIS — O9912 Other diseases of the blood and blood-forming organs and certain disorders involving the immune mechanism complicating childbirth: Secondary | ICD-10-CM | POA: Diagnosis present

## 2019-12-27 DIAGNOSIS — O99334 Smoking (tobacco) complicating childbirth: Secondary | ICD-10-CM | POA: Diagnosis present

## 2019-12-27 DIAGNOSIS — D62 Acute posthemorrhagic anemia: Secondary | ICD-10-CM | POA: Diagnosis not present

## 2019-12-27 DIAGNOSIS — O26899 Other specified pregnancy related conditions, unspecified trimester: Secondary | ICD-10-CM

## 2019-12-27 DIAGNOSIS — Z3490 Encounter for supervision of normal pregnancy, unspecified, unspecified trimester: Secondary | ICD-10-CM | POA: Diagnosis present

## 2019-12-27 DIAGNOSIS — O139 Gestational [pregnancy-induced] hypertension without significant proteinuria, unspecified trimester: Secondary | ICD-10-CM

## 2019-12-27 LAB — CBC
HCT: 36.5 % (ref 36.0–46.0)
HCT: 37.7 % (ref 36.0–46.0)
Hemoglobin: 12.2 g/dL (ref 12.0–15.0)
Hemoglobin: 12.6 g/dL (ref 12.0–15.0)
MCH: 31.3 pg (ref 26.0–34.0)
MCH: 30.8 pg (ref 26.0–34.0)
MCHC: 33.4 g/dL (ref 30.0–36.0)
MCHC: 33.4 g/dL (ref 30.0–36.0)
MCV: 93.6 fL (ref 80.0–100.0)
MCV: 92.2 fL (ref 80.0–100.0)
Platelets: 224 10*3/uL (ref 150–400)
Platelets: 218 10*3/uL (ref 150–400)
RBC: 3.9 MIL/uL (ref 3.87–5.11)
RBC: 4.09 MIL/uL (ref 3.87–5.11)
RDW: 14.1 % (ref 11.5–15.5)
RDW: 13.8 % (ref 11.5–15.5)
WBC: 9.5 10*3/uL (ref 4.0–10.5)
WBC: 14.9 10*3/uL — ABNORMAL HIGH (ref 4.0–10.5)
nRBC: 0 % (ref 0.0–0.2)
nRBC: 0 % (ref 0.0–0.2)

## 2019-12-27 LAB — RESP PANEL BY RT-PCR (FLU A&B, COVID) ARPGX2
Influenza A by PCR: NEGATIVE
Influenza B by PCR: NEGATIVE
SARS Coronavirus 2 by RT PCR: NEGATIVE

## 2019-12-27 LAB — TYPE AND SCREEN
ABO/RH(D): A NEG
Antibody Screen: POSITIVE

## 2019-12-27 MED ORDER — SOD CITRATE-CITRIC ACID 500-334 MG/5ML PO SOLN: 30.0000 mL | ORAL | Status: DC | PRN

## 2019-12-27 MED ORDER — PHENYLEPHRINE 40 MCG/ML (10ML) SYRINGE FOR IV PUSH (FOR BLOOD PRESSURE SUPPORT)
80.0000 ug | PREFILLED_SYRINGE | INTRAVENOUS | Status: DC | PRN
  Filled 2019-12-27: qty 10

## 2019-12-27 MED ORDER — PRENATAL MULTIVITAMIN CH
1.0000 | ORAL_TABLET | Freq: Every day | ORAL | Status: DC
  Administered 2019-12-28 – 2019-12-29 (×2): 1 via ORAL
  Filled 2019-12-27 (×2): qty 1

## 2019-12-27 MED ORDER — ACETAMINOPHEN 325 MG PO TABS: 650.0000 mg | ORAL_TABLET | ORAL | Status: DC | PRN

## 2019-12-27 MED ORDER — OXYTOCIN BOLUS FROM INFUSION
333.0000 mL | Freq: Once | INTRAVENOUS | Status: AC
  Administered 2019-12-27: 333 mL/h via INTRAVENOUS

## 2019-12-27 MED ORDER — EPHEDRINE 5 MG/ML INJ: 10.0000 mg | INTRAVENOUS | Status: DC | PRN

## 2019-12-27 MED ORDER — SIMETHICONE 80 MG PO CHEW: 80.0000 mg | CHEWABLE_TABLET | ORAL | Status: DC | PRN

## 2019-12-27 MED ORDER — FENTANYL-BUPIVACAINE-NACL 0.5-0.125-0.9 MG/250ML-% EP SOLN
12.0000 mL/h | EPIDURAL | Status: DC | PRN
  Filled 2019-12-27: qty 250

## 2019-12-27 MED ORDER — ENOXAPARIN SODIUM 40 MG/0.4ML ~~LOC~~ SOLN
40.0000 mg | SUBCUTANEOUS | Status: DC
  Administered 2019-12-28: 40 mg via SUBCUTANEOUS
  Filled 2019-12-27: qty 0.4

## 2019-12-27 MED ORDER — DIPHENHYDRAMINE HCL 50 MG/ML IJ SOLN: 12.5000 mg | INTRAMUSCULAR | Status: DC | PRN

## 2019-12-27 MED ORDER — BENZOCAINE-MENTHOL 20-0.5 % EX AERO
1.0000 "application " | INHALATION_SPRAY | CUTANEOUS | Status: DC | PRN
  Administered 2019-12-27: 1 via TOPICAL
  Filled 2019-12-27: qty 56

## 2019-12-27 MED ORDER — LACTATED RINGERS IV SOLN: 500.0000 mL | Freq: Once | INTRAVENOUS | Status: DC

## 2019-12-27 MED ORDER — ACETAMINOPHEN 325 MG PO TABS
650.0000 mg | ORAL_TABLET | ORAL | Status: DC | PRN
  Administered 2019-12-27 – 2019-12-29 (×10): 650 mg via ORAL
  Filled 2019-12-27 (×10): qty 2

## 2019-12-27 MED ORDER — ONDANSETRON HCL 4 MG PO TABS: 4.0000 mg | ORAL_TABLET | ORAL | Status: DC | PRN

## 2019-12-27 MED ORDER — DIPHENHYDRAMINE HCL 25 MG PO CAPS: 25.0000 mg | ORAL_CAPSULE | Freq: Four times a day (QID) | ORAL | Status: DC | PRN

## 2019-12-27 MED ORDER — SODIUM CHLORIDE (PF) 0.9 % IJ SOLN
INTRAMUSCULAR | Status: DC | PRN
  Administered 2019-12-27: 12 mL/h via EPIDURAL

## 2019-12-27 MED ORDER — ONDANSETRON HCL 4 MG/2ML IJ SOLN: 4.0000 mg | INTRAMUSCULAR | Status: DC | PRN

## 2019-12-27 MED ORDER — OXYTOCIN-SODIUM CHLORIDE 30-0.9 UT/500ML-% IV SOLN: 2.5000 [IU]/h | INTRAVENOUS | Status: DC

## 2019-12-27 MED ORDER — ONDANSETRON HCL 4 MG/2ML IJ SOLN: 4.0000 mg | Freq: Four times a day (QID) | INTRAMUSCULAR | Status: DC | PRN

## 2019-12-27 MED ORDER — TETANUS-DIPHTH-ACELL PERTUSSIS 5-2.5-18.5 LF-MCG/0.5 IM SUSY: 0.5000 mL | PREFILLED_SYRINGE | Freq: Once | INTRAMUSCULAR | Status: DC

## 2019-12-27 MED ORDER — SENNOSIDES-DOCUSATE SODIUM 8.6-50 MG PO TABS
2.0000 | ORAL_TABLET | ORAL | Status: DC
  Administered 2019-12-27 – 2019-12-28 (×2): 2 via ORAL
  Filled 2019-12-27 (×2): qty 2

## 2019-12-27 MED ORDER — WITCH HAZEL-GLYCERIN EX PADS: 1.0000 "application " | MEDICATED_PAD | CUTANEOUS | Status: DC | PRN

## 2019-12-27 MED ORDER — LIDOCAINE HCL (PF) 1 % IJ SOLN
30.0000 mL | INTRAMUSCULAR | Status: AC | PRN
  Administered 2019-12-27: 30 mL via SUBCUTANEOUS
  Filled 2019-12-27: qty 30

## 2019-12-27 MED ORDER — LIDOCAINE HCL (PF) 1 % IJ SOLN
INTRAMUSCULAR | Status: DC | PRN
  Administered 2019-12-27: 5 mL via EPIDURAL

## 2019-12-27 MED ORDER — PHENYLEPHRINE 40 MCG/ML (10ML) SYRINGE FOR IV PUSH (FOR BLOOD PRESSURE SUPPORT): 80.0000 ug | PREFILLED_SYRINGE | INTRAVENOUS | Status: DC | PRN

## 2019-12-27 MED ORDER — LACTATED RINGERS IV SOLN
500.0000 mL | INTRAVENOUS | Status: DC | PRN
  Administered 2019-12-27 (×2): 500 mL via INTRAVENOUS

## 2019-12-27 MED ORDER — OXYCODONE HCL 5 MG PO TABS: 5.0000 mg | ORAL_TABLET | ORAL | Status: DC | PRN

## 2019-12-27 MED ORDER — LACTATED RINGERS IV SOLN: INTRAVENOUS | Status: DC

## 2019-12-27 MED ORDER — COCONUT OIL OIL: 1.0000 "application " | TOPICAL_OIL | Status: DC | PRN

## 2019-12-27 MED ORDER — OXYCODONE HCL 5 MG PO TABS
10.0000 mg | ORAL_TABLET | ORAL | Status: DC | PRN
  Administered 2019-12-27 – 2019-12-29 (×10): 10 mg via ORAL
  Filled 2019-12-27 (×10): qty 2

## 2019-12-27 MED ORDER — OXYTOCIN-SODIUM CHLORIDE 30-0.9 UT/500ML-% IV SOLN
1.0000 m[IU]/min | INTRAVENOUS | Status: DC
  Administered 2019-12-27: 2 m[IU]/min via INTRAVENOUS
  Filled 2019-12-27: qty 500

## 2019-12-27 MED ORDER — DIBUCAINE (PERIANAL) 1 % EX OINT: 1.0000 "application " | TOPICAL_OINTMENT | CUTANEOUS | Status: DC | PRN

## 2019-12-27 MED ORDER — ALBUTEROL SULFATE HFA 108 (90 BASE) MCG/ACT IN AERS
2.0000 | INHALATION_SPRAY | RESPIRATORY_TRACT | Status: DC | PRN
  Filled 2019-12-27: qty 6.7

## 2019-12-27 MED ORDER — TERBUTALINE SULFATE 1 MG/ML IJ SOLN: 0.2500 mg | Freq: Once | INTRAMUSCULAR | Status: DC | PRN

## 2019-12-27 NOTE — H&P (Signed)
Kerry Duncan is a 39 y.o. [id number] at [redacted]w[redacted]d gestation presents for IOL.  H/o protein C and protein S deficiency, has been on lovenox during pregnancy.  Last dose >24 hrs ago.    Antepartum course: as above, ama wiht nml testing ; asthma, stable [facility] at [facility] since 8 wks.  See complete pre-natal records  History OB History    Gravida  4   Para  2   Term  1   Preterm  1   AB  1   Living  1     SAB  1   TAB      Ectopic      Multiple  0   Live Births  1          Past Medical History:  Diagnosis Date  . Anemia, iron deficiency 04/24/2017  . Asthma   . Protein C deficiency (HCC)    tested in 2008 and found to not have protein C deficiency  . Protein S deficiency (HCC)   . PTSD (post-traumatic stress disorder)   . Seizure (HCC)    none since 2019   Past Surgical History:  Procedure Laterality Date  . APPLICATION OF A-CELL OF CHEST/ABDOMEN N/A 03/21/2019   Procedure: APPLICATION OF A-CELL;  Surgeon: Griselda Miner, MD;  Location: [facility];  Service: General;  Laterality: N/A;  . DILATION AND CURETTAGE OF UTERUS    . INCISION AND DRAINAGE ABSCESS N/A 03/21/2019   Procedure: INCISION AND DRAINAGE OF PILONIDAL ABSCESS;  Surgeon: Griselda Miner, MD;  Location: [facility];  Service: General;  Laterality: N/A;  . MOUTH SURGERY     Family History: family history includes Asthma in her father; COPD in her father; Diabetes in her mother; Heart attack in her father; Heart disease in her father; Hypertension in her father and mother; Hypothyroidism in her mother; Irritable bowel syndrome in her mother; Seizures in her sister; Stroke in her maternal grandfather and mother; Varicose Veins in her mother. Social History:  reports that she has been smoking cigarettes. She has been smoking about 0.50 packs per day. She has never used smokeless tobacco. She reports that she does not drink alcohol and does not use drugs.  ROS: See above  otherwise negative  Prenatal labs:  ABO, Rh: --/--/A NEG (11/27 1236) Antibody: PENDING (11/27 1236) Rubella:   immune RPR:   nr HBsAg:   nr HIV:  nr GBS: Negative/-- (11/01 0000)  1 hr Glucola: Normal Genetic screening: Normal Anatomy US: Normal  Physical Exam:   Dilation: 7 Effacement (%): 90 Exam by:: Antasia Haider, MD Blood pressure (!) 142/82, pulse 71, temperature 98.3 F (36.8 C), temperature source Oral, height 5\' 7"  (1.702 m), weight 79.7 kg, last menstrual period 03/25/2019. A&O x 3 HEENT: Normal Lungs: CTAB CV: RRR Abdominal: Soft, Non-tender, Gravid and Estimated fetal weight: 7- 7 1/2 lbs lbs  Lower Extremities: Non-edematous, Non-tender  Pelvic Exam:      Dilatation: 7cm     Effacement: 90%     Station: -2     Presentation: Cephalic; arom with copious fluid, clear  Labs:  CBC:  Lab Results  Component Value Date   WBC 9.5 12/27/2019   RBC 3.90 12/27/2019   HGB 12.2 12/27/2019   HCT 36.5 12/27/2019   MCV 93.6 12/27/2019   MCH 31.3 12/27/2019   MCHC 33.4 12/27/2019   RDW 14.1 12/27/2019   PLT 224 12/27/2019   CMP:  Lab Results  Component  Value Date   NA 132 (L) 12/17/2019   K 3.5 12/17/2019   CL 104 12/17/2019   CO2 20 (L) 12/17/2019   GLUCOSE 86 12/17/2019   BUN 7 12/17/2019   CREATININE 0.43 (L) 12/17/2019   CALCIUM 8.6 (L) 12/17/2019   PROT 6.2 (L) 12/17/2019   AST 21 12/17/2019   ALT 16 12/17/2019   ALBUMIN 3.2 (L) 12/17/2019   ALKPHOS 100 12/17/2019   BILITOT 0.3 12/17/2019   GFRNONAA >60 12/17/2019   GFRAA >60 09/04/2019   ANIONGAP 8 12/17/2019   Urine: Lab Results  Component Value Date   COLORURINE YELLOW 12/17/2019   APPEARANCEUR CLEAR 12/17/2019   LABSPEC 1.013 12/17/2019   PHURINE 7.0 12/17/2019   GLUCOSEU NEGATIVE 12/17/2019   HGBUR NEGATIVE 12/17/2019   BILIRUBINUR NEGATIVE 12/17/2019   KETONESUR 5 (A) 12/17/2019   PROTEINUR NEGATIVE 12/17/2019   NITRITE NEGATIVE 12/17/2019   LEUKOCYTESUR NEGATIVE 12/17/2019      Prenatal Transfer Tool  Maternal Diabetes: No Genetic Screening: Normal Maternal Ultrasounds/Referrals: Normal Fetal Ultrasounds or other Referrals:  None Maternal Substance Abuse:  No Significant Maternal Medications:  Meds include: Other:   lovenox Significant Maternal Lab Results: Group B Strep negative  FHT:120s, nml variability, +accels, no decels TOCO: q 2 min  Assessment/Plan:  39 y.o. [id number] at [redacted]w[redacted]d gestation   1. IOL - arom, pitocin; plan svd 2. Fetal status reassuring 3. gbs neg 4. Rh neg, s/p rhogam 5. RI 6. H/o iufd (23wga) 7. Protein c and protein s deficiency - last lovenox dose >24 hrs ago, plan resume 24 hr pp 8.  ama - nml nipt 9.  Fetal status reassuring   Vick Frees 12/27/2019, 1:30 PM

## 2019-12-27 NOTE — Anesthesia Procedure Notes (Signed)
Epidural Patient location during procedure: OB Start time: 12/27/2019 1:40 PM End time: 12/27/2019 1:52 PM  Staffing Anesthesiologist: Trevor Iha, MD Performed: anesthesiologist   Preanesthetic Checklist Completed: patient identified, IV checked, site marked, risks and benefits discussed, surgical consent, monitors and equipment checked, pre-op evaluation and timeout performed  Epidural Patient position: sitting Prep: DuraPrep and site prepped and draped Patient monitoring: continuous pulse ox and blood pressure Approach: midline Location: L3-L4 Injection technique: LOR air  Needle:  Needle type: Tuohy  Needle gauge: 17 G Needle length: 9 cm and 9 Needle insertion depth: 5 cm cm Catheter type: closed end flexible Catheter size: 19 Gauge Catheter at skin depth: 10 cm Test dose: negative  Assessment Events: blood not aspirated, injection not painful, no injection resistance, no paresthesia and negative IV test  Additional Notes Patient identified. Risks/Benefits/Options discussed with patient including but not limited to bleeding, infection, nerve damage, paralysis, failed block, incomplete pain control, headache, blood pressure changes, nausea, vomiting, reactions to medication both or allergic, itching and postpartum back pain. Confirmed with bedside nurse the patient's most recent platelet count. Confirmed with patient that they are not currently taking any anticoagulation, have any bleeding history or any family history of bleeding disorders. Patient expressed understanding and wished to proceed. All questions were answered. Sterile technique was used throughout the entire procedure. Please see nursing notes for vital signs. Test dose was given through epidural needle and negative prior to continuing to dose epidural or start infusion. Warning signs of high block given to the patient including shortness of breath, tingling/numbness in hands, complete motor block, or any  concerning symptoms with instructions to call for help. Patient was given instructions on fall risk and not to get out of bed. All questions and concerns addressed with instructions to call with any issues.1 Attempt (S) . Patient tolerated procedure well.

## 2019-12-27 NOTE — Anesthesia Preprocedure Evaluation (Addendum)
Anesthesia Evaluation  Patient identified by MRN, date of birth, ID band Patient awake    Reviewed: Allergy & Precautions, NPO status , Patient's Chart, lab work & pertinent test results  Airway Mallampati: II  TM Distance: >3 FB Neck ROM: Full    Dental no notable dental hx. (+) Teeth Intact, Dental Advisory Given   Pulmonary asthma , Current Smoker and Patient abstained from smoking.,  Negative covid   Pulmonary exam normal breath sounds clear to auscultation       Cardiovascular Exercise Tolerance: Good negative cardio ROS Normal cardiovascular exam Rhythm:Regular Rate:Normal     Neuro/Psych Seizures -, Well Controlled,  Anxiety Last oct 2020    GI/Hepatic negative GI ROS, Neg liver ROS,   Endo/Other    Renal/GU      Musculoskeletal negative musculoskeletal ROS (+)   Abdominal   Peds  Hematology Hx of protein C and S deficiency  On lovenox last dose 11/25 Hgb 12.2 Plt 224    Anesthesia Other Findings   Reproductive/Obstetrics (+) Pregnancy                             Anesthesia Physical Anesthesia Plan  ASA: III  Anesthesia Plan: Epidural   Post-op Pain Management:    Induction:   PONV Risk Score and Plan:   Airway Management Planned:   Additional Equipment:   Intra-op Plan:   Post-operative Plan:   Informed Consent: I have reviewed the patients History and Physical, chart, labs and discussed the procedure including the risks, benefits and alternatives for the proposed anesthesia with the patient or authorized representative who has indicated his/her understanding and acceptance.       Plan Discussed with:   Anesthesia Plan Comments: (39 Wk G4P1 for LEA off lovenox for > 24 hours)       Anesthesia Quick Evaluation

## 2019-12-28 LAB — CBC
HCT: 34.3 % — ABNORMAL LOW (ref 36.0–46.0)
Hemoglobin: 11.6 g/dL — ABNORMAL LOW (ref 12.0–15.0)
MCH: 31.4 pg (ref 26.0–34.0)
MCHC: 33.8 g/dL (ref 30.0–36.0)
MCV: 92.7 fL (ref 80.0–100.0)
Platelets: 200 10*3/uL (ref 150–400)
RBC: 3.7 MIL/uL — ABNORMAL LOW (ref 3.87–5.11)
RDW: 13.8 % (ref 11.5–15.5)
WBC: 12 10*3/uL — ABNORMAL HIGH (ref 4.0–10.5)
nRBC: 0 % (ref 0.0–0.2)

## 2019-12-28 LAB — RPR: RPR Ser Ql: NONREACTIVE

## 2019-12-28 MED ORDER — RHO D IMMUNE GLOBULIN 1500 UNIT/2ML IJ SOSY
300.0000 ug | PREFILLED_SYRINGE | Freq: Once | INTRAMUSCULAR | Status: AC
  Administered 2019-12-28: 300 ug via INTRAVENOUS
  Filled 2019-12-28: qty 2

## 2019-12-28 MED ORDER — NIFEDIPINE ER OSMOTIC RELEASE 30 MG PO TB24
30.0000 mg | ORAL_TABLET | Freq: Every day | ORAL | Status: DC
  Administered 2019-12-28 – 2019-12-29 (×2): 30 mg via ORAL
  Filled 2019-12-28 (×2): qty 1

## 2019-12-28 NOTE — Progress Notes (Signed)
CSW received consult for hx of PTSD and OCD.  CSW met with MOB at bedside to offer support and complete assessment.  On arrival, CSW introduced self and stated reason for visit. Infant Quentin was present and in MOB's arms. MOB was pleasant and engaged during visit.   During assessment, MOB confirmed h/o PTSD and OCD, added perinatal anxiety. MOB stated PTSD and OCD have been well managed without sx since 2018. MOB reported sx of perinatal anxiety were related building new home and to triggers associated with hx of stillbirth. MOB stated she is doing much better now that baby is here. MOB denied any other BH dx, SI, HI, or domestic violence. MOB reports ongoing counseling with therapist Bryan/[facility]. MOB identified coping skills as countering intrusive thoughts with focusing energy on positive activities and/or projects. MOB identified current mood as "pretty good", and support as FOB, 2 sisters, and friends. MOB declined any additional resources and/or referrals at this time.   CSW provided education regarding the baby blues period vs. perinatal mood disorders, discussed treatment and gave resources for mental health follow up if concerns arise.  CSW recommends self-evaluation during the postpartum time period using the New Mom Checklist from Postpartum Progress and encouraged MOB to contact a medical professional if symptoms are noted at any time. MOB stated understanding and denied any questions.     CSW provided review of Sudden Infant Death Syndrome (SIDS) precautions. MOB stated understanding and denied any questions. MOB confirmed having all needed items for baby including car seat and both bassinet and crib for baby's safe sleep.     CSW identifies no further need for intervention and no barriers to discharge at this time.  Kayliana Codd D. Jarrick Fjeld, MSW, LCSW Clinical Social Worker [phone] 

## 2019-12-28 NOTE — Progress Notes (Signed)
Cramping, taking narcotic for relief; tol po, voiding w/o difficulty, lochia wnl No h/a, vision changes, ruq pain  Patient Vitals for the past 24 hrs:  BP Temp Temp src Pulse Resp SpO2 Height Weight  12/28/19 0445 (!) 144/89 -- -- 92 18 100 % -- --  12/28/19 0130 130/75 -- -- 65 18 100 % -- --  12/27/19 2110 136/80 98.4 F (36.9 C) Oral 77 18 99 % -- --  12/27/19 2010 (!) 146/77 -- -- 73 18 98 % -- --  12/27/19 1933 (!) 157/85 -- -- 87 -- -- -- --  12/27/19 1907 (!) 146/83 -- -- 38 -- -- -- --  12/27/19 1848 140/78 -- -- 85 -- -- -- --  12/27/19 1830 121/85 -- -- (!) 108 -- -- -- --  12/27/19 1817 (!) 139/106 -- -- 77 -- -- -- --  12/27/19 1800 (!) 153/115 -- -- 81 -- -- -- --  12/27/19 1748 (!) 155/109 -- -- (!) 118 -- -- -- --  12/27/19 1730 (!) 144/81 -- -- -- -- -- -- --  12/27/19 1638 109/66 -- -- 64 -- -- -- --  12/27/19 1605 128/78 -- -- 69 -- -- -- --  12/27/19 1531 136/86 97.8 F (36.6 C) Oral (!) 107 -- -- -- --  12/27/19 1501 (!) 125/95 -- -- 74 -- -- -- --  12/27/19 1432 131/63 -- -- 67 -- -- -- --  12/27/19 1426 133/80 -- -- 69 -- -- -- --  12/27/19 1410 (!) 156/95 -- -- 69 -- 100 % -- --  12/27/19 1405 (!) 154/87 -- -- 75 -- -- -- --  12/27/19 1402 (!) 145/92 -- -- 74 -- 100 % -- --  12/27/19 1356 (!) 149/88 -- -- 73 -- 100 % -- --  12/27/19 1353 (!) 142/88 -- -- 77 -- -- -- --  12/27/19 1352 (!) 119/96 -- -- 80 -- 100 % -- --  12/27/19 1349 (!) 155/95 -- -- 70 -- -- -- --  12/27/19 1340 (!) 144/90 -- -- 74 -- -- -- --  12/27/19 1258 (!) 142/82 -- -- 71 -- -- -- --  12/27/19 1217 -- -- -- -- -- -- 5\' 7"  (1.702 m) 79.7 kg  12/27/19 1208 120/80 98.3 F (36.8 C) Oral 82 -- -- -- --   A&ox3 nml respirations Abd: soft,nt, nd; fundus firm and below umb LE:   CBC Latest Ref Rng & Units 12/28/2019 12/27/2019 12/27/2019  WBC 4.0 - 10.5 K/uL 12.0(H) 14.9(H) 9.5  Hemoglobin 12.0 - 15.0 g/dL 11.6(L) 12.6 12.2  Hematocrit 36 - 46 % 34.3(L) 37.7 36.5  Platelets 150 -  400 K/uL 200 218 224   A/P: ppd 1 s/p svd 1.  GHTN - mild range bps, nml labs with admission, monitor to see if needs medical management; plan d/c home tomorrow 2. Anemia - acute d/t blood loss, plan iron rich foods 3. Rh neg, rhogam if indicated 4. RI 5. Protein c and s deficiency - begin lovenox 24 hr pp 6. Boy - wants circ, plan tomorrow d/t feeding issues

## 2019-12-28 NOTE — Progress Notes (Signed)
Vs reviewed. Needs to start Procardia 30mg  XL daily Patient Vitals for the past 24 hrs:  BP Temp Temp src Pulse Resp SpO2  12/28/19 1455 (!) 142/94 98.4 F (36.9 C) Oral 69 18 100 %  12/28/19 1206 (!) 158/86 98.4 F (36.9 C) Oral 70 18 98 %  12/28/19 0445 (!) 144/89 -- -- 92 18 100 %  12/28/19 0130 130/75 -- -- 65 18 100 %  12/27/19 2110 136/80 98.4 F (36.9 C) Oral 77 18 99 %  12/27/19 2010 (!) 146/77 -- -- 73 18 98 %  12/27/19 1933 (!) 157/85 -- -- 87 -- --  12/27/19 1907 (!) 146/83 -- -- 68 -- --  12/27/19 1848 140/78 -- -- 85 -- --  12/27/19 1830 121/85 -- -- (!) 108 -- --  12/27/19 1817 (!) 139/106 -- -- 77 -- --  12/27/19 1800 (!) 153/115 -- -- 81 -- --  12/27/19 1748 (!) 155/109 -- -- (!) 118 -- --

## 2019-12-28 NOTE — Anesthesia Postprocedure Evaluation (Signed)
Anesthesia Post Note  Patient: Kerry Duncan  Procedure(s) Performed: AN AD HOC LABOR EPIDURAL     Patient location during evaluation: Mother Baby Anesthesia Type: Epidural Level of consciousness: awake and alert, oriented and patient cooperative Pain management: pain level controlled Vital Signs Assessment: post-procedure vital signs reviewed and stable Respiratory status: spontaneous breathing Cardiovascular status: stable Postop Assessment: no headache, epidural receding, patient able to bend at knees and no signs of nausea or vomiting Anesthetic complications: no Comments: Pt. States she is walking.  Pain score 7.    No complications documented.  Last Vitals:  Vitals:   12/28/19 0130 12/28/19 0445  BP: 130/75 (!) 144/89  Pulse: 65 92  Resp: 18 18  Temp:    SpO2: 100% 100%    Last Pain:  Vitals:   12/28/19 0645  TempSrc:   PainSc: 7    Pain Goal:                   [facility]

## 2019-12-29 DIAGNOSIS — O26899 Other specified pregnancy related conditions, unspecified trimester: Secondary | ICD-10-CM

## 2019-12-29 DIAGNOSIS — Z6791 Unspecified blood type, Rh negative: Secondary | ICD-10-CM

## 2019-12-29 DIAGNOSIS — O139 Gestational [pregnancy-induced] hypertension without significant proteinuria, unspecified trimester: Secondary | ICD-10-CM

## 2019-12-29 LAB — RH IG WORKUP (INCLUDES ABO/RH)
ABO/RH(D): A NEG
Fetal Screen: NEGATIVE
Gestational Age(Wks): 39
Unit division: 0

## 2019-12-29 MED ORDER — OXYCODONE HCL 10 MG PO TABS: 10.0000 mg | ORAL_TABLET | ORAL | 0 refills | Status: AC | PRN

## 2019-12-29 MED ORDER — NIFEDIPINE ER 30 MG PO TB24: 30.0000 mg | ORAL_TABLET | Freq: Every day | ORAL | 1 refills | Status: AC

## 2019-12-29 MED ORDER — ACETAMINOPHEN 325 MG PO TABS: 650.0000 mg | ORAL_TABLET | ORAL | Status: AC | PRN

## 2019-12-29 MED ORDER — BENZOCAINE-MENTHOL 20-0.5 % EX AERO: 1.0000 "application " | INHALATION_SPRAY | CUTANEOUS | Status: AC | PRN

## 2019-12-29 MED ORDER — COCONUT OIL OIL: 1.0000 "application " | TOPICAL_OIL | 0 refills | Status: AC | PRN

## 2019-12-29 NOTE — Progress Notes (Signed)
PPD #2, SVD s/p IOL for GHTN, 2nd degree perineal, bilateral labial and vaginal repair, baby boy   S:  Reports feeling better now that she knows she can go home. C/o perineal pain and bladder tenderness/cramping requiring Tylenol and Oxycodone IR. Motrin contraindicated due to Lovenox.   Denies HA, visual changes, RUQ/epigastric pain. States she had a little difficulty clearing her throat last night, but no SOB             Tolerating po/ No nausea or vomiting / Denies dizziness or SOB             Bleeding is getting lighter             Up ad lib / ambulatory / voiding QS without difficulty  Newborn breast feeding  / Circumcision - completed  O:               VS: BP 123/79   Pulse 88   Temp 98.8 F (37.1 C) (Oral)   Resp 18   Ht 5\' 7"  (1.702 m)   Wt 79.7 kg   LMP 03/25/2019   SpO2 98%   Breastfeeding Unknown   BMI 27.52 kg/m   Patient Vitals for the past 24 hrs:  BP Temp Temp src Pulse Resp SpO2  12/29/19 0933 123/79 -- -- 88 -- --  12/29/19 0535 131/86 -- -- 80 18 98 %  12/28/19 1953 (!) 159/92 98.8 F (37.1 C) Oral 85 15 99 %  12/28/19 1455 (!) 142/94 98.4 F (36.9 C) Oral 69 18 100 %  12/28/19 1206 (!) 158/86 98.4 F (36.9 C) Oral 70 18 98 %     LABS:             Recent Labs    12/27/19 1830 12/28/19 0432  WBC 14.9* 12.0*  HGB 12.6 11.6*  PLT 218 200               Blood type: --/--/A NEG (11/28 0432)  Rubella:        Immune               I&O: Intake/Output      11/28 0701 - 11/29 0700 11/29 0701 - 11/30 0700   Urine (mL/kg/hr)     Blood     Total Output     Net          Flu: declines COVID vaccine: declines Tdap: UTD              Physical Exam:             Alert and oriented X3  Lungs: Clear and unlabored  Heart: regular rate and rhythm / no murmurs  Abdomen: soft, non-tender, non-distended              Fundus: firm, non-tender, U-3  Perineum: well approximated, no edema or ecchymosis   Lochia: small rubra on pad   Extremities: no edema, no calf pain  or tenderness, +1 DTRs, no clonus bilaterally     A/P: PPD # 2, SVD  Gestational hypertension   - tolerating Procardia well and BPs responding well   - No neural s/s   - PEC precautions reviewed   Hx. Of Protein S deficiency   - continue Lovenox daily and f/u with heme PP  Hx. Of mild ABL Anemia   - stable, continue on prenatal vitamin and increase iron rich foods  RH negative, Baby RH pos   - s/p Rhogam 11/28  Doing well - stable status  Routine post partum orders  Discharge home today  WOB discharge book given, instructions and warning s/s reviewed  F/u on Wednesday for BP check   Carlean Jews, MSN, CNM [facility]

## 2019-12-29 NOTE — Discharge Summary (Signed)
Postpartum Discharge Summary  Date of Service updated 12/29/2019     Patient Name: Kerry Duncan DOB: [birth year] MRN: [id number]  Date of admission: 12/27/2019 Delivery date:12/27/2019  Delivering provider: Tiana Loft E  Date of discharge: 12/29/2019  Admitting diagnosis: Encounter for induction of labor [Z34.90] Intrauterine pregnancy: [redacted]w[redacted]d    Secondary diagnosis:  Principal Problem:   Postpartum care following vaginal delivery (11/29) Active Problems:   Protein S deficiency ([place]   SVD (spontaneous vaginal delivery)   Second-degree perineal laceration, with delivery   Encounter for induction of labor   Obstetrical laceration: bilateral labial and vaginal lacerations    Gestational hypertension   Rh negative status during pregnancy  Additional problems: Hx. Of migraines, Iron deficiency anemia, hx of asthma, AMA    Discharge diagnosis: Term Pregnancy Delivered and Gestational Hypertension                                              Post partum procedures:rhogam Augmentation: AROM and Pitocin Complications: None  Hospital course: Scheduled IOL with vaginal delivery 39y.o. yo [id number] admitted on 12/27/2019 for IOL for gestational hypertension. Patient had an uncomplicated labor course as follows:  Membrane Rupture Time/Date: 1:24 PM ,12/27/2019   Delivery Method:Vaginal, Spontaneous  Episiotomy: None  Lacerations:  2nd degree;Labial;Vaginal  Patient's postpartum course was complicated by elevated blood pressures requiring Procardia 3070mL, which was started on 11/28. She is tolerating it well and BPs improved.  She is ambulating, tolerating a regular diet, passing flatus, and urinating well. Patient is discharged home in stable condition on 12/29/19.  Newborn Data: Birth date:12/27/2019  Birth time:5:27 PM  Gender:Female  Living status:Living  Apgars:9 ,9  Weight:3164 g   Magnesium Sulfate received: No BMZ received:  No Rhophylac:Yes MMR:N/A T-DaP:Given prenatally Flu: Declined COVID-19 vaccine: declined  Transfusion:No  Physical exam  Vitals:   12/28/19 1455 12/28/19 1953 12/29/19 0535 12/29/19 0933  BP: (!) 142/94 (!) 159/92 131/86 123/79  Pulse: 69 85 80 88  Resp: _0 Temp: 98.4 F (36.9 C) 98.8 F (37.1 C)    TempSrc: Oral Oral    SpO2: 100% 99% 98%   Weight:      Height:       General: alert, cooperative and no distress  Heart: RRR Lungs: clear, equal bilaterally  Lochia: appropriate Uterine Fundus: firm, below umbilicus  Perineum: well approximated repair, no edema or ecchymosis DVT Evaluation: No evidence of DVT seen on physical exam. No significant calf/ankle edema. +1 DTRs and no clonus bilaterally  Labs: Lab Results  Component Value Date   WBC 12.0 (H) 12/28/2019   HGB 11.6 (L) 12/28/2019   HCT 34.3 (L) 12/28/2019   MCV 92.7 12/28/2019   PLT 200 12/28/2019   CMP Latest Ref Rng & Units 12/17/2019  Glucose 70 - 99 mg/dL 86  BUN 6 - 20 mg/dL 7  Creatinine 0.44 - 1.00 mg/dL 0.43(L)  Sodium 135 - 145 mmol/L 132(L)  Potassium 3.5 - 5.1 mmol/L 3.5  Chloride 98 - 111 mmol/L 104  CO2 22 - 32 mmol/L 20(L)  Calcium 8.9 - 10.3 mg/dL 8.6(L)  Total Protein 6.5 - 8.1 g/dL 6.2(L)  Total Bilirubin 0.3 - 1.2 mg/dL 0.3  Alkaline Phos 38 - 126 U/L 100  AST 15 - 41 U/L 21  ALT 0 - 44  U/L 16   Edinburgh Score: Edinburgh Postnatal Depression Scale Screening Tool 12/27/2019  I have been able to laugh and see the funny side of things. (No Data)  I have looked forward with enjoyment to things. -  I have blamed myself unnecessarily when things went wrong. -  I have been anxious or worried for no good reason. -  I have felt scared or panicky for no good reason. -  Things have been getting on top of me. -  I have been so unhappy that I have had difficulty sleeping. -  I have felt sad or miserable. -  I have been so unhappy that I have been crying. -  The thought of harming  myself has occurred to me. Flavia Shipper Postnatal Depression Scale Total -      After visit meds:  Allergies as of 12/29/2019      Reactions   Topamax [topiramate] Other (See Comments)   Caused blurred vision, hair loss, weight loss   Aleve [naproxen Sodium] Nausea And Vomiting   Burns stomach   Eggs Or Egg-derived Products Swelling, Other (See Comments)   Stomach cramping   Mobic [meloxicam] Other (See Comments)   Ulcers.   Sulfa Antibiotics    Sulfur Nausea And Vomiting   Keppra [levetiracetam] Anxiety, Other (See Comments)   Causes suicidal ideation.      Medication List    TAKE these medications   acetaminophen 325 MG tablet Commonly known as: Tylenol Take 2 tablets (650 mg total) by mouth every 4 (four) hours as needed (for pain scale < 4).   albuterol 108 (90 Base) MCG/ACT inhaler Commonly known as: VENTOLIN HFA Inhale 2 puffs into the lungs every 4 (four) hours as needed. For wheezing   benzocaine-Menthol 20-0.5 % Aero Commonly known as: DERMOPLAST Apply 1 application topically as needed for irritation (perineal discomfort).   Butalbital-APAP-Caffeine 50-300-40 MG Caps Take 1 capsule by mouth 2 (two) times daily as needed. As needed for headache   coconut oil Oil Apply 1 application topically as needed.   enoxaparin 40 MG/0.4ML injection Commonly known as: LOVENOX Inject 40 mg into the skin daily.   multivitamin-prenatal 27-0.8 MG Tabs tablet Take 1 tablet by mouth daily at 12 noon.   NIFEdipine 30 MG 24 hr tablet Commonly known as: ADALAT CC Take 1 tablet (30 mg total) by mouth daily. Start taking on: December 30, 2019   Oxycodone HCl 10 MG Tabs Take 1 tablet (10 mg total) by mouth every 4 (four) hours as needed (pain scale > 7).            Discharge Care Instructions  (From admission, onward)         Start     Ordered   12/29/19 0000  Discharge wound care:       Comments: Warm water sitz baths as needed   12/29/19 1132            Discharge home in stable condition Infant Feeding: Breast Infant Disposition:home with mother Discharge instruction: per After Visit Summary and Postpartum booklet. Activity: Advance as tolerated. Pelvic rest for 6 weeks.  Diet: low salt diet Anticipated Birth Control: not discussed Postpartum Appointment:6 weeks Additional Postpartum F/U: Postpartum Depression checkup and BP check 2-3 days Future Appointments: Future Appointments  Date Time Provider [place]  09/09/2020 11:30 AM [facility] None  09/09/2020 12:00 PM Brunetta Genera, MD [facility] None   Follow up Visit:  Follow-up Information    Taavon,  Richard, MD. Schedule an appointment as soon as possible for a visit in 2 day(s).   Specialty: Obstetrics and Gynecology Why: Blood pressure check  Contact information: [place] [address] [phone]               Plan of care reviewed and discussed with Dr. Benjie Karvonen and agrees     12/29/2019 Darliss Cheney, CNM

## 2019-12-30 NOTE — Addendum Note (Signed)
Addendum  created 12/30/19 1027 by Trevor Iha, MD   Clinical Note Signed

## 2020-01-03 ENCOUNTER — Inpatient Hospital Stay (HOSPITAL_COMMUNITY): Admit: 2020-01-03 | Payer: Self-pay

## 2020-09-08 ENCOUNTER — Telehealth: Payer: Self-pay | Admitting: Hematology

## 2020-09-08 NOTE — Telephone Encounter (Signed)
Left message with rescheduled upcoming appointment due to provider's emergency. 

## 2020-09-09 ENCOUNTER — Inpatient Hospital Stay: Payer: 59 | Admitting: Hematology

## 2020-09-09 ENCOUNTER — Inpatient Hospital Stay: Payer: 59

## 2020-09-27 ENCOUNTER — Other Ambulatory Visit: Payer: Self-pay | Admitting: Hematology

## 2020-09-27 DIAGNOSIS — D6859 Other primary thrombophilia: Secondary | ICD-10-CM

## 2020-09-27 DIAGNOSIS — D509 Iron deficiency anemia, unspecified: Secondary | ICD-10-CM

## 2020-09-27 NOTE — Progress Notes (Unsigned)
Labs ordered.

## 2020-09-28 ENCOUNTER — Inpatient Hospital Stay: Payer: 59 | Admitting: Hematology

## 2020-09-28 ENCOUNTER — Inpatient Hospital Stay: Payer: 59 | Attending: Hematology

## 2020-09-29 ENCOUNTER — Telehealth: Payer: Self-pay | Admitting: Hematology

## 2020-09-29 NOTE — Telephone Encounter (Signed)
Scheduled per 8/30 sch msg. Called and left msg

## 2020-10-08 ENCOUNTER — Telehealth: Payer: Self-pay | Admitting: Hematology

## 2020-10-08 ENCOUNTER — Inpatient Hospital Stay: Payer: 59 | Admitting: Hematology

## 2020-10-08 ENCOUNTER — Inpatient Hospital Stay: Payer: 59

## 2020-10-08 NOTE — Telephone Encounter (Signed)
Scheduled missed appt per sch msg. Called and left msg

## 2020-10-12 ENCOUNTER — Inpatient Hospital Stay: Payer: 59 | Attending: Hematology

## 2020-10-12 ENCOUNTER — Inpatient Hospital Stay: Payer: 59 | Admitting: Hematology

## 2023-01-29 ENCOUNTER — Other Ambulatory Visit (HOSPITAL_BASED_OUTPATIENT_CLINIC_OR_DEPARTMENT_OTHER): Payer: Self-pay

## 2023-02-14 ENCOUNTER — Other Ambulatory Visit (HOSPITAL_BASED_OUTPATIENT_CLINIC_OR_DEPARTMENT_OTHER): Payer: Self-pay

## 2023-06-26 ENCOUNTER — Other Ambulatory Visit (HOSPITAL_BASED_OUTPATIENT_CLINIC_OR_DEPARTMENT_OTHER): Payer: Self-pay
